# Patient Record
Sex: Female | Born: 2000 | Race: Black or African American | Hispanic: Yes | Marital: Single | Smoking: Never smoker
Health system: Southern US, Community
[De-identification: ages and names within clinical notes are randomized; demographics above are authoritative.]

## PROBLEM LIST (undated history)

## (undated) DIAGNOSIS — IMO0002 Reserved for concepts with insufficient information to code with codable children: Secondary | ICD-10-CM

## (undated) DIAGNOSIS — M329 Systemic lupus erythematosus, unspecified: Secondary | ICD-10-CM

## (undated) DIAGNOSIS — E559 Vitamin D deficiency, unspecified: Secondary | ICD-10-CM

## (undated) HISTORY — PX: SKIN BIOPSY: SHX1

---
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
## 2020-12-18 ENCOUNTER — Emergency Department (HOSPITAL_COMMUNITY)
Admission: EM | Admit: 2020-12-18 | Discharge: 2020-12-18 | Disposition: A | Discharge status: Home / Self Care | Payer: Medicaid Other | Attending: Emergency Medicine | Admitting: Emergency Medicine

## 2020-12-18 ENCOUNTER — Encounter (HOSPITAL_COMMUNITY): Payer: Self-pay

## 2020-12-18 ENCOUNTER — Other Ambulatory Visit: Payer: Self-pay

## 2020-12-18 DIAGNOSIS — R197 Diarrhea, unspecified: Secondary | ICD-10-CM | POA: Diagnosis not present

## 2020-12-18 DIAGNOSIS — R110 Nausea: Secondary | ICD-10-CM | POA: Insufficient documentation

## 2020-12-18 DIAGNOSIS — Z20822 Contact with and (suspected) exposure to covid-19: Secondary | ICD-10-CM | POA: Insufficient documentation

## 2020-12-18 DIAGNOSIS — R109 Unspecified abdominal pain: Secondary | ICD-10-CM | POA: Diagnosis not present

## 2020-12-18 DIAGNOSIS — R519 Headache, unspecified: Secondary | ICD-10-CM | POA: Diagnosis not present

## 2020-12-18 HISTORY — DX: Vitamin D deficiency, unspecified: E55.9

## 2020-12-18 HISTORY — DX: Reserved for concepts with insufficient information to code with codable children: IMO0002

## 2020-12-18 HISTORY — DX: Systemic lupus erythematosus, unspecified: M32.9

## 2020-12-18 LAB — COMPREHENSIVE METABOLIC PANEL
ALT: 16 U/L (ref 0–44)
AST: 32 U/L (ref 15–41)
Albumin: 4.2 g/dL (ref 3.5–5.0)
Alkaline Phosphatase: 85 U/L (ref 38–126)
Anion gap: 7 (ref 5–15)
BUN: 8 mg/dL (ref 6–20)
CO2: 28 mmol/L (ref 22–32)
Calcium: 9.1 mg/dL (ref 8.9–10.3)
Chloride: 101 mmol/L (ref 98–111)
Creatinine, Ser: 0.44 mg/dL (ref 0.44–1.00)
GFR, Estimated: >60 mL/min (ref >60)
Glucose, Bld: 87 mg/dL (ref 70–99)
Potassium: 3.9 mmol/L (ref 3.5–5.1)
Sodium: 136 mmol/L (ref 135–145)
Total Bilirubin: 0.3 mg/dL (ref 0.3–1.2)
Total Protein: 8.4 g/dL — ABNORMAL HIGH (ref 6.5–8.1)

## 2020-12-18 LAB — CBC WITH DIFFERENTIAL/PLATELET
Abs Immature Granulocytes: 0 10*3/uL (ref 0.00–0.07)
Basophils Absolute: 0 10*3/uL (ref 0.0–0.1)
Basophils Relative: 0 %
Eosinophils Absolute: 0 10*3/uL (ref 0.0–0.5)
Eosinophils Relative: 0 %
HCT: 48.2 % — ABNORMAL HIGH (ref 36.0–46.0)
Hemoglobin: 15.1 g/dL — ABNORMAL HIGH (ref 12.0–15.0)
Immature Granulocytes: 0 %
Lymphocytes Relative: 36 %
Lymphs Abs: 0.9 10*3/uL (ref 0.7–4.0)
MCH: 26.7 pg (ref 26.0–34.0)
MCHC: 31.3 g/dL (ref 30.0–36.0)
MCV: 85.2 fL (ref 80.0–100.0)
Monocytes Absolute: 0.1 10*3/uL (ref 0.1–1.0)
Monocytes Relative: 5 %
Neutro Abs: 1.4 10*3/uL — ABNORMAL LOW (ref 1.7–7.7)
Neutrophils Relative %: 59 %
Platelets: 182 10*3/uL (ref 150–400)
RBC: 5.66 MIL/uL — ABNORMAL HIGH (ref 3.87–5.11)
RDW: 13.2 % (ref 11.5–15.5)
WBC: 2.4 10*3/uL — ABNORMAL LOW (ref 4.0–10.5)
nRBC: 0 % (ref 0.0–0.2)

## 2020-12-18 LAB — I-STAT BETA HCG BLOOD, ED (MC, WL, AP ONLY): I-stat hCG, quantitative: <5 m[IU]/mL (ref <5)

## 2020-12-18 LAB — RESP PANEL BY RT-PCR (FLU A&B, COVID) ARPGX2
Influenza A by PCR: NEGATIVE
Influenza B by PCR: NEGATIVE
SARS Coronavirus 2 by RT PCR: NEGATIVE

## 2020-12-18 LAB — LIPASE, BLOOD: Lipase: 27 U/L (ref 11–51)

## 2020-12-18 MED ORDER — IBUPROFEN 600 MG PO TABS: 600.0000 mg | ORAL_TABLET | Freq: Four times a day (QID) | ORAL | 0 refills | Status: DC | PRN

## 2020-12-18 MED ORDER — ONDANSETRON HCL 4 MG/2ML IJ SOLN
4.0000 mg | Freq: Once | INTRAMUSCULAR | Status: AC
  Administered 2020-12-18: 4 mg via INTRAVENOUS
  Filled 2020-12-18: qty 2

## 2020-12-18 MED ORDER — DICYCLOMINE HCL 20 MG PO TABS: 20.0000 mg | ORAL_TABLET | Freq: Two times a day (BID) | ORAL | 0 refills | Status: DC | PRN

## 2020-12-18 MED ORDER — METHYLPREDNISOLONE SODIUM SUCC 125 MG IJ SOLR
125.0000 mg | Freq: Once | INTRAMUSCULAR | Status: AC
  Administered 2020-12-18: 125 mg via INTRAVENOUS
  Filled 2020-12-18: qty 2

## 2020-12-18 MED ORDER — DICYCLOMINE HCL 10 MG PO CAPS
20.0000 mg | ORAL_CAPSULE | Freq: Once | ORAL | Status: AC
  Administered 2020-12-18: 20 mg via ORAL
  Filled 2020-12-18: qty 2

## 2020-12-18 NOTE — ED Provider Notes (Signed)
[facility]-EMERGENCY DEPT Provider Note   CSN: [id number] Arrival date & time: 12/18/20  1053     History Chief Complaint  Patient presents with   Generalized Body Aches   Emesis    Alexandria Russell is a 20 y.o. female with a history of lupus presenting to emergency department with generalized body aches and nausea.  The patient reports that she moved to [facility] and does not have a rheumatologist, but was referred to 1 by her new PCP last week.  She says in the past she was on prednisone for several months for management of lupus, but stopped taking the medication due to side effects, which included poor appetite, nausea and vomiting.  She comes to the ED today complaining of generalized body aches.  She reports she is got tender nodes in her arms and legs which are bothering her.  She reports that she has not had a lot of vomiting but some dry heaving, as well as cramping diarrhea and abdominal pain.  She has been taking Zofran as needed for nausea, otherwise no medications.  She does have a Nexplanon and has irregular menstrual periods, most recently in July.  HPI     Past Medical History:  Diagnosis Date   Lupus (HCC)    Vitamin D deficiency     There are no problems to display for this patient.   History reviewed. No pertinent surgical history.   OB History   No obstetric history on file.     Family History  Problem Relation Age of Onset   Diverticulitis Mother     Social History   Tobacco Use   Smoking status: Never   Smokeless tobacco: Never  Vaping Use   Vaping Use: Every day   Substances: Nicotine  Substance Use Topics   Alcohol use: Yes   Drug use: Yes    Types: Marijuana    Home Medications Prior to Admission medications   Medication Sig Start Date End Date Taking? Authorizing Provider  dicyclomine (BENTYL) 20 MG tablet Take 1 tablet (20 mg total) by mouth 2 (two) times daily as needed for up to 20 doses for spasms. 12/18/20   Yes Zarriah Starkel, Kermit Balo, MD  ibuprofen (ADVIL) 600 MG tablet Take 1 tablet (600 mg total) by mouth every 6 (six) hours as needed for up to 30 doses for mild pain or moderate pain. 12/18/20  Yes Kaislyn Gulas, Kermit Balo, MD    Allergies    Penicillins  Review of Systems   Review of Systems  Constitutional:  Negative for chills and fever.  HENT:  Negative for ear pain and sore throat.   Eyes:  Negative for pain and visual disturbance.  Respiratory:  Negative for cough and shortness of breath.   Cardiovascular:  Negative for chest pain and palpitations.  Gastrointestinal:  Positive for abdominal pain and nausea. Negative for vomiting.  Genitourinary:  Negative for dysuria and hematuria.  Musculoskeletal:  Positive for arthralgias and myalgias.  Skin:  Negative for color change and rash.  Neurological:  Negative for syncope and headaches.  All other systems reviewed and are negative.  Physical Exam Updated Vital Signs BP 107/68   Pulse 61   Temp 98.7 F (37.1 C) (Oral)   Resp 16   Ht 5' (1.524 m)   Wt 47.6 kg   SpO2 99%   BMI 20.51 kg/m   Physical Exam Constitutional:      General: She is not in acute distress. HENT:  Head: Normocephalic and atraumatic.  Eyes:     Conjunctiva/sclera: Conjunctivae normal.     Pupils: Pupils are equal, round, and reactive to light.  Cardiovascular:     Rate and Rhythm: Normal rate and regular rhythm.  Pulmonary:     Effort: Pulmonary effort is normal. No respiratory distress.  Abdominal:     General: There is no distension.     Tenderness: There is no abdominal tenderness.  Musculoskeletal:     Comments: Tenderness of bilateral shins, no visible nodes  Skin:    General: Skin is warm and dry.  Neurological:     General: No focal deficit present.     Mental Status: She is alert. Mental status is at baseline.  Psychiatric:        Mood and Affect: Mood normal.        Behavior: Behavior normal.    ED Results / Procedures / Treatments    Labs (all labs ordered are listed, but only abnormal results are displayed) Labs Reviewed  COMPREHENSIVE METABOLIC PANEL - Abnormal; Notable for the following components:      Result Value   Total Protein 8.4 (*)    All other components within normal limits  CBC WITH DIFFERENTIAL/PLATELET - Abnormal; Notable for the following components:   WBC 2.4 (*)    RBC 5.66 (*)    Hemoglobin 15.1 (*)    HCT 48.2 (*)    Neutro Abs 1.4 (*)    All other components within normal limits  RESP PANEL BY RT-PCR (FLU A&B, COVID) ARPGX2  LIPASE, BLOOD  I-STAT BETA HCG BLOOD, ED ([facility], [facility], [facility] ONLY)    EKG None  Radiology No results found.  Procedures Procedures   Medications Ordered in ED Medications  ondansetron (ZOFRAN) injection 4 mg (4 mg Intravenous Given 12/18/20 1220)  methylPREDNISolone sodium succinate (SOLU-MEDROL) 125 mg/2 mL injection 125 mg (125 mg Intravenous Given 12/18/20 1220)  dicyclomine (BENTYL) capsule 20 mg (20 mg Oral Given 12/18/20 1220)    ED Course  I have reviewed the triage vital signs and the nursing notes.  Pertinent labs & imaging results that were available during my care of the patient were reviewed by me and considered in my medical decision making (see chart for details).  Differential diagnosis for patient's symptoms include irritable bowel disease versus autoimmune condition versus other.  Suspect this is likely a chronic condition related to her lupus or autoimmune disease.  She may have nodules related to this.  We will check her basic labs, give her some Zofran, shot of IV Solu-Medrol, as well as Bentyl for cramping diarrhea and abdominal pain.  She does not appear toxic otherwise.  She does not have focal tenderness of the right lower quadrant to suspect appendicitis.  The chronicity of her symptoms more likely indicate an autoimmune condition, and explained to her the importance of needing follow-up and establishing care with a rheumatologist for this.  I would  avoid starting her on chronic prednisone at this point as she felt she had  adverse side effects to long-term treatment.  I would defer this decision to discussion between her and her rheumatologist, and she has a referral currently to see one.   Labs reviewed -mild leukopenia, otherwise labs unremarkable.  Her leukopenia may be related to her autoimmune condition; she otherwise does not appear to be pancytopenic.  COVID is negative.   Final Clinical Impression(s) / ED Diagnoses Final diagnoses:  Abdominal pain, unspecified abdominal location    Rx /  DC Orders ED Discharge Orders          Ordered    dicyclomine (BENTYL) 20 MG tablet  2 times daily PRN        12/18/20 1415    ibuprofen (ADVIL) 600 MG tablet  Every 6 hours PRN        12/18/20 1415             Braeden Dolinski, Kermit Balo, MD 12/18/20 1713

## 2020-12-18 NOTE — ED Triage Notes (Signed)
Patient c/o generalized body aches x 1 month and vomiting when she woke this AM.  Patient also c/o raised areas/lumps on her legs and abdomen.

## 2020-12-28 ENCOUNTER — Encounter (HOSPITAL_COMMUNITY): Payer: Self-pay | Admitting: Emergency Medicine

## 2020-12-28 ENCOUNTER — Other Ambulatory Visit: Payer: Self-pay

## 2020-12-28 ENCOUNTER — Emergency Department (HOSPITAL_COMMUNITY)
Admission: EM | Admit: 2020-12-28 | Discharge: 2020-12-29 | Discharge status: Against Medical Advice | Payer: Medicaid Other | Attending: Physician Assistant | Admitting: Physician Assistant

## 2020-12-28 DIAGNOSIS — R109 Unspecified abdominal pain: Secondary | ICD-10-CM | POA: Diagnosis not present

## 2020-12-28 DIAGNOSIS — R110 Nausea: Secondary | ICD-10-CM | POA: Diagnosis not present

## 2020-12-28 DIAGNOSIS — R519 Headache, unspecified: Secondary | ICD-10-CM | POA: Insufficient documentation

## 2020-12-28 DIAGNOSIS — Z5321 Procedure and treatment not carried out due to patient leaving prior to being seen by health care provider: Secondary | ICD-10-CM | POA: Insufficient documentation

## 2020-12-28 DIAGNOSIS — M549 Dorsalgia, unspecified: Secondary | ICD-10-CM | POA: Diagnosis not present

## 2020-12-28 LAB — COMPREHENSIVE METABOLIC PANEL
ALT: 19 U/L (ref 0–44)
AST: 27 U/L (ref 15–41)
Albumin: 4.1 g/dL (ref 3.5–5.0)
Alkaline Phosphatase: 76 U/L (ref 38–126)
Anion gap: 9 (ref 5–15)
BUN: 9 mg/dL (ref 6–20)
CO2: 26 mmol/L (ref 22–32)
Calcium: 9.2 mg/dL (ref 8.9–10.3)
Chloride: 100 mmol/L (ref 98–111)
Creatinine, Ser: 0.57 mg/dL (ref 0.44–1.00)
GFR, Estimated: >60 mL/min (ref >60)
Glucose, Bld: 132 mg/dL — ABNORMAL HIGH (ref 70–99)
Potassium: 3.7 mmol/L (ref 3.5–5.1)
Sodium: 135 mmol/L (ref 135–145)
Total Bilirubin: 0.8 mg/dL (ref 0.3–1.2)
Total Protein: 7.9 g/dL (ref 6.5–8.1)

## 2020-12-28 LAB — CBC WITH DIFFERENTIAL/PLATELET
Abs Immature Granulocytes: 0.02 10*3/uL (ref 0.00–0.07)
Basophils Absolute: 0 10*3/uL (ref 0.0–0.1)
Basophils Relative: 0 %
Eosinophils Absolute: 0.2 10*3/uL (ref 0.0–0.5)
Eosinophils Relative: 3 %
HCT: 40.4 % (ref 36.0–46.0)
Hemoglobin: 12.9 g/dL (ref 12.0–15.0)
Immature Granulocytes: 0 %
Lymphocytes Relative: 21 %
Lymphs Abs: 1.4 10*3/uL (ref 0.7–4.0)
MCH: 27.2 pg (ref 26.0–34.0)
MCHC: 31.9 g/dL (ref 30.0–36.0)
MCV: 85.2 fL (ref 80.0–100.0)
Monocytes Absolute: 0.4 10*3/uL (ref 0.1–1.0)
Monocytes Relative: 7 %
Neutro Abs: 4.6 10*3/uL (ref 1.7–7.7)
Neutrophils Relative %: 69 %
Platelets: 183 10*3/uL (ref 150–400)
RBC: 4.74 MIL/uL (ref 3.87–5.11)
RDW: 13.4 % (ref 11.5–15.5)
WBC: 6.6 10*3/uL (ref 4.0–10.5)
nRBC: 0 % (ref 0.0–0.2)

## 2020-12-28 LAB — I-STAT BETA HCG BLOOD, ED (MC, WL, AP ONLY): I-stat hCG, quantitative: <5 m[IU]/mL (ref <5)

## 2020-12-28 NOTE — ED Notes (Signed)
Pt left ED with mother. Pt encouraged to stay. Pt declined.

## 2020-12-28 NOTE — ED Provider Notes (Signed)
Emergency Medicine Provider Triage Evaluation Note  Zenith Kercheval , a 20 y.o. female  was evaluated in triage.  Pt complains of headache an popping sensation in her brain. States she smoked marijuana pta and 5 min later she felt a pop inside her head and she feels like her brian is bleeding.  Review of Systems  Positive: headache Negative: vomiting  Physical Exam  BP (!) 140/91 (BP Location: Right Arm)   Pulse (!) 110   Temp 98.6 F (37 C) (Oral)   Resp 14   SpO2 100%  Gen:   Awake, no distress   Resp:  Normal effort  MSK:   Moves extremities without difficulty  Other:  No facial droop, clear speech, 5/5 strength to the bue/ble  Medical Decision Making  Medically screening exam initiated at 8:47 PM.  Appropriate orders placed.  Leafy Jane was informed that the remainder of the evaluation will be completed by another provider, this initial triage assessment does not replace that evaluation, and the importance of remaining in the ED until their evaluation is complete.     Rayne Du 12/28/20 2048    Mancel Bale, MD 12/30/20 1133

## 2020-12-28 NOTE — ED Triage Notes (Addendum)
Patient here with feeling of abdominal pain and swelling after smoking THC.  Nausea.  Speaking in complete sentences.  Oxygen 100%.  Headache.

## 2020-12-29 ENCOUNTER — Emergency Department (HOSPITAL_COMMUNITY)
Admission: EM | Admit: 2020-12-29 | Discharge: 2020-12-29 | Disposition: A | Discharge status: Home / Self Care | Payer: Medicaid Other | Source: Home / Self Care | Attending: Emergency Medicine | Admitting: Emergency Medicine

## 2020-12-29 ENCOUNTER — Encounter (HOSPITAL_COMMUNITY): Payer: Self-pay

## 2020-12-29 ENCOUNTER — Emergency Department (HOSPITAL_COMMUNITY): Payer: Medicaid Other

## 2020-12-29 DIAGNOSIS — R1084 Generalized abdominal pain: Secondary | ICD-10-CM | POA: Insufficient documentation

## 2020-12-29 DIAGNOSIS — R110 Nausea: Secondary | ICD-10-CM | POA: Insufficient documentation

## 2020-12-29 DIAGNOSIS — M549 Dorsalgia, unspecified: Secondary | ICD-10-CM | POA: Insufficient documentation

## 2020-12-29 LAB — COMPREHENSIVE METABOLIC PANEL
ALT: 18 U/L (ref 0–44)
AST: 25 U/L (ref 15–41)
Albumin: 4.2 g/dL (ref 3.5–5.0)
Alkaline Phosphatase: 79 U/L (ref 38–126)
Anion gap: 7 (ref 5–15)
BUN: 10 mg/dL (ref 6–20)
CO2: 25 mmol/L (ref 22–32)
Calcium: 9.5 mg/dL (ref 8.9–10.3)
Chloride: 104 mmol/L (ref 98–111)
Creatinine, Ser: 0.42 mg/dL — ABNORMAL LOW (ref 0.44–1.00)
GFR, Estimated: >60 mL/min (ref >60)
Glucose, Bld: 75 mg/dL (ref 70–99)
Potassium: 3.8 mmol/L (ref 3.5–5.1)
Sodium: 136 mmol/L (ref 135–145)
Total Bilirubin: 0.7 mg/dL (ref 0.3–1.2)
Total Protein: 8.3 g/dL — ABNORMAL HIGH (ref 6.5–8.1)

## 2020-12-29 LAB — URINALYSIS, ROUTINE W REFLEX MICROSCOPIC
Bilirubin Urine: NEGATIVE
Glucose, UA: NEGATIVE mg/dL
Hgb urine dipstick: NEGATIVE
Ketones, ur: NEGATIVE mg/dL
Leukocytes,Ua: NEGATIVE
Nitrite: NEGATIVE
Protein, ur: NEGATIVE mg/dL
Specific Gravity, Urine: 1.01 (ref 1.005–1.030)
pH: 8 (ref 5.0–8.0)

## 2020-12-29 LAB — CBC
HCT: 42.5 % (ref 36.0–46.0)
Hemoglobin: 13.3 g/dL (ref 12.0–15.0)
MCH: 26.9 pg (ref 26.0–34.0)
MCHC: 31.3 g/dL (ref 30.0–36.0)
MCV: 86 fL (ref 80.0–100.0)
Platelets: 189 10*3/uL (ref 150–400)
RBC: 4.94 MIL/uL (ref 3.87–5.11)
RDW: 13.7 % (ref 11.5–15.5)
WBC: 3.8 10*3/uL — ABNORMAL LOW (ref 4.0–10.5)
nRBC: 0 % (ref 0.0–0.2)

## 2020-12-29 LAB — LIPASE, BLOOD: Lipase: 35 U/L (ref 11–51)

## 2020-12-29 MED ORDER — IOHEXOL 350 MG/ML SOLN
100.0000 mL | Freq: Once | INTRAVENOUS | Status: AC | PRN
  Administered 2020-12-29: 60 mL via INTRAVENOUS

## 2020-12-29 NOTE — ED Notes (Signed)
IV found in room on bedside table. Patient and belongings not found in room.

## 2020-12-29 NOTE — ED Notes (Signed)
PA aware patient has eloped.

## 2020-12-29 NOTE — ED Provider Notes (Signed)
[facility]-EMERGENCY DEPT Provider Note   CSN: [id number] Arrival date & time: 12/29/20  1142     History Chief Complaint  Patient presents with   Abdominal Pain    Alexandria Russell is a 20 y.o. female.  Patient with history of lupus presents today with abdominal pain.  States the pain has been intermittent over the last 6 months however is worsened in the last day and is now radiating to her back.  Some associated nausea and occasional vomiting, however no vomiting during this episode.  She has difficulty localizing the pain, states it is diffuse throughout her abdomen.  Denies fever, chills, chest pain, shortness of breath.  Last bowel movement was this morning and normal.  Of note, history of long-term prednisone use, but has not been taking this for over a year.  She has not seen a rheumatologist or been on any lupus medication over the past year.  The history is provided by the patient.  Abdominal Pain Associated symptoms: no chest pain, no chills, no constipation, no cough, no diarrhea, no fatigue, no fever, no nausea, no shortness of breath, no sore throat and no vomiting       Past Medical History:  Diagnosis Date   Lupus (HCC)    Vitamin D deficiency     There are no problems to display for this patient.   History reviewed. No pertinent surgical history.   OB History   No obstetric history on file.     Family History  Problem Relation Age of Onset   Diverticulitis Mother     Social History   Tobacco Use   Smoking status: Never   Smokeless tobacco: Never  Vaping Use   Vaping Use: Every day   Substances: Nicotine  Substance Use Topics   Alcohol use: Yes   Drug use: Yes    Types: Marijuana    Home Medications Prior to Admission medications   Medication Sig Start Date End Date Taking? Authorizing Provider  dicyclomine (BENTYL) 20 MG tablet Take 1 tablet (20 mg total) by mouth 2 (two) times daily as needed for up to 20 doses for  spasms. 12/18/20   Terald Sleeper, MD  ibuprofen (ADVIL) 600 MG tablet Take 1 tablet (600 mg total) by mouth every 6 (six) hours as needed for up to 30 doses for mild pain or moderate pain. 12/18/20   Terald Sleeper, MD    Allergies    Penicillins  Review of Systems   Review of Systems  Constitutional:  Negative for chills, fatigue and fever.  HENT:  Negative for congestion, postnasal drip, rhinorrhea, sore throat, trouble swallowing and voice change.   Respiratory:  Negative for cough and shortness of breath.   Cardiovascular:  Negative for chest pain, palpitations and leg swelling.  Gastrointestinal:  Positive for abdominal pain. Negative for abdominal distention, constipation, diarrhea, nausea and vomiting.  Skin:  Negative for color change, pallor, rash and wound.  Neurological:  Negative for tremors, seizures, syncope, facial asymmetry, speech difficulty, weakness and numbness.  Psychiatric/Behavioral:  Negative for confusion and decreased concentration.   All other systems reviewed and are negative.  Physical Exam Updated Vital Signs BP 113/82 (BP Location: Right Arm)   Pulse 87   Temp 98.4 F (36.9 C) (Oral)   Resp 16   SpO2 99%   Physical Exam Vitals and nursing note reviewed.  Constitutional:      General: She is not in acute distress.    Appearance: Normal  appearance. She is not ill-appearing, toxic-appearing or diaphoretic.  HENT:     Head: Normocephalic.     Mouth/Throat:     Mouth: Mucous membranes are moist.  Eyes:     General: No scleral icterus. Cardiovascular:     Rate and Rhythm: Normal rate and regular rhythm.     Heart sounds: No murmur heard. Pulmonary:     Effort: Pulmonary effort is normal. No respiratory distress.     Breath sounds: Normal breath sounds. No stridor. No wheezing or rales.  Abdominal:     General: Abdomen is flat. Bowel sounds are normal.     Palpations: Abdomen is soft.     Tenderness: There is generalized abdominal tenderness.  There is no guarding or rebound. Negative signs include Murphy's sign, Rovsing's sign, McBurney's sign, psoas sign and obturator sign.  Musculoskeletal:     Cervical back: Normal range of motion.  Skin:    General: Skin is warm and dry.  Neurological:     General: No focal deficit present.     Mental Status: She is alert.     Motor: No weakness.  Psychiatric:        Mood and Affect: Mood normal.        Behavior: Behavior normal.    ED Results / Procedures / Treatments   Labs (all labs ordered are listed, but only abnormal results are displayed) Labs Reviewed  CBC  COMPREHENSIVE METABOLIC PANEL  LIPASE, BLOOD    EKG None  Radiology CT ABDOMEN PELVIS W CONTRAST  Result Date: 12/29/2020 CLINICAL DATA:  Abdominal pain, acute, nonlocalized EXAM: CT ABDOMEN AND PELVIS WITH CONTRAST TECHNIQUE: Multidetector CT imaging of the abdomen and pelvis was performed using the standard protocol following bolus administration of intravenous contrast. CONTRAST:  32mL OMNIPAQUE IOHEXOL 350 MG/ML SOLN COMPARISON:  None. FINDINGS: Lower chest: No acute abnormality. Hepatobiliary: No focal liver abnormality is seen. No gallstones, gallbladder wall thickening, or biliary dilatation. Pancreas: Unremarkable. No pancreatic ductal dilatation or surrounding inflammatory changes. Spleen: Unremarkable Adrenals/Urinary Tract: Adrenals, kidneys, and bladder are unremarkable. Stomach/Bowel: Stomach is within normal limits. Bowel is normal in caliber. Normal appendix. Vascular/Lymphatic: No significant vascular findings. No enlarged lymph nodes. Reproductive: Uterus and bilateral adnexa are unremarkable. Other: There are patchy areas of infiltration of the subcutaneous fat of the abdominopelvic wall and included thighs. For example, more confluent areas within ventral abdominal wall near the umbilicus (series 2, image 42). Musculoskeletal: No acute or significant osseous abnormality IMPRESSION: Patchy areas of  infiltration of subcutaneous fat of the abdominopelvic bowl and included upper thighs. For example, ventral abdominal wall near the umbilicus. This is nonspecific but could be infectious/inflammatory. Otherwise unremarkable study. Electronically Signed   By: Guadlupe Spanish M.D.   On: 12/29/2020 14:20    Procedures Procedures   Medications Ordered in ED Medications - No data to display  ED Course  I have reviewed the triage vital signs and the nursing notes.  Pertinent labs & imaging results that were available during my care of the patient were reviewed by me and considered in my medical decision making (see chart for details).    MDM Rules/Calculators/A&P                         Presents today with abdominal pain that has been intermittent over the last 6 months, however is worse today with radiating to the back. Nontoxic, vitals unremarkable, patient is afebrile, nontoxic-appearing, and in no acute distress.  Additional history obtained:  Additional history obtained from chart review & nursing note review.   Lab Tests:  I Ordered, reviewed, and interpreted labs, which included:  CBC, CMP, lipase, UA No anemia or leukocytosis.  Electrolytes, pancreas, kidney, liver function normal.  UA normal  Imaging Studies ordered:  I ordered imaging studies which included CT abdomen and pelvis with contrast, I independently reviewed, formal radiology impression shows:  Patchy areas of infiltration of subcutaneous fat of the abdominopelvic bowl and included upper thighs. For example, ventral abdominal wall near the umbilicus. This is nonspecific but could be infectious/inflammatory.  ED Course:  Patient presents with intermittent abdominal pain worsened today and is radiating to her back.  Labs unremarkable, however CT abdomen and pelvis showed unusual findings of areas of infiltration of subcutaneous fat.  Gust findings with radiologist who read the CT Dr. Allena Katz, who was unsure what to make  of these findings.  Recommended discussing further with patient and considering ultrasound to visualize these areas.   Unfortunately, upon entering patient's room to discuss results and further evaluate for potential causes of findings on CT, patient was not in room, IV was removed and lying on bedside table, patient's belongings were also missing.  Confirmed with nursing staff that patient had eloped.  Portions of this note were generated with Scientist, clinical (histocompatibility and immunogenetics). Dictation errors may occur despite best attempts at proofreading.         Final Clinical Impression(s) / ED Diagnoses Final diagnoses:  Generalized abdominal pain    Rx / DC Orders ED Discharge Orders     None        Vear Clock 12/29/20 2217    Lorre Nick, MD 12/30/20 1144

## 2020-12-29 NOTE — ED Triage Notes (Signed)
Pt arrived via POV, c/o diffuse abd pain, radiating to back.

## 2021-02-19 ENCOUNTER — Ambulatory Visit: Payer: Medicaid Other | Admitting: Internal Medicine

## 2021-02-20 NOTE — Progress Notes (Signed)
Office Visit Note  Patient: Alexandria Russell             Date of Birth: 2000-04-20           MRN: [id number]             PCP: Zara Chess, NP Referring: Zara Chess, NP Visit Date: 02/21/2021 Occupation: Photographer work  Subjective:  New Patient (Initial Visit) (Lupus)   History of Present Illness: Alexandria Russell is a 20 y.o. 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 She was originally diagnosed about 2 years ago with initial steroid treatment of about 6 months. Primary symptoms at that time include joint pains in multiple sites and skin rashes on the face and nodular skin rashes on her torso and limbs. Diagnosis including skin biopsy taken from the leg and arm and breast. She was treated with imuran and hydroxychloroquine apparently stopped imuran due to nausea and switch to mycophenolate. She stopped taking all medications due to feeling quite sick on the combination and did not follow up regularly for about 2 years. She experienced several flares of symptom worsening with joint pain and pain over areas of skin swelling and redness usually treated with short term steroid treatments. She moved from [place] area to [place] around the start of this year and established with local physician treated with oral prednisone 76m daily at this time partially controlling symptoms. She last had an episode of increased symptoms about 3 weeks ago. She is also taking high dose weekly vitamin d supplementation for low vitamin D. Besides the panniculitis symptoms she also reports patchy alopecia on the scalp and on temporal areas associated with posterior cervical adenopathy. She does notice symptoms increased with high amounts of sun exposure. She has lost about 10-20 pounds since this all started but stable during the past year. She denies any raynaud's symptoms or any history of blood clots.  Labs reviewed 12/2020 Vit D 15.0  05/2018 ANA 1:160 dsDNA neg ESR 27 CRP <4 Complement C3 C4  wnl TPMT 18.9 14-3-3 eta neg ANCAs neg TB neg HBV HCV neg   Activities of Daily Living:  Patient reports morning stiffness for 2 hours.   Patient Reports nocturnal pain.  Difficulty dressing/grooming: Denies Difficulty climbing stairs: Reports Difficulty getting out of chair: Denies Difficulty using hands for taps, buttons, cutlery, and/or writing: Denies  Review of Systems  Constitutional:  Negative for fatigue.  HENT:  Negative for mouth dryness.   Eyes:  Negative for dryness.  Respiratory:  Negative for shortness of breath.   Cardiovascular:  Positive for swelling in legs/feet.  Gastrointestinal:  Negative for constipation.  Endocrine: Positive for heat intolerance.  Genitourinary:  Negative for difficulty urinating.  Musculoskeletal:  Positive for joint pain, joint pain, joint swelling, muscle weakness, morning stiffness and muscle tenderness.  Skin:  Positive for rash.  Allergic/Immunologic: Positive for susceptible to infections.  Neurological:  Positive for weakness.  Hematological:  Positive for bruising/bleeding tendency.  Psychiatric/Behavioral:  Positive for sleep disturbance.    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   Objective: Vital Signs: BP 99/62 (BP Location: Left Arm, Patient Position: Sitting, Cuff Size: Normal)   Pulse 83   Resp 14   Ht 5' (1.524 m)   Wt 112 lb (50.8 kg)   BMI 21.87 kg/m    Physical Exam HENT:     Right Ear: External ear normal.     Left Ear:  External ear normal.     Mouth/Throat:     Mouth: Mucous membranes are moist.     Pharynx: Oropharynx is clear.  Eyes:     Conjunctiva/sclera: Conjunctivae normal.  Cardiovascular:     Rate and Rhythm: Normal rate and regular rhythm.  Pulmonary:     Effort: Pulmonary effort is normal.     Breath sounds: Normal breath sounds.  Musculoskeletal:     Right lower leg: No edema.     Left lower leg: No edema.  Lymphadenopathy:     Cervical: No cervical adenopathy.  Skin:    General: Skin is warm and dry.     Comments: Firm tender subcutaneous nodule with overlying erythema and warmth in left cheek, right thigh Firm nodules in left upper arm, abdominal wall most concentrated around umbilicus  Neurological:     Mental Status: She is alert.  Psychiatric:        Mood and Affect: Mood normal.      Musculoskeletal Exam:  Neck full ROM no tenderness Shoulders full ROM no tenderness or swelling Elbows full ROM no tenderness or swelling Wrists full ROM no tenderness or swelling Fingers full ROM no tenderness or swelling Knees full ROM no tenderness or swelling Ankles full ROM no tenderness or swelling   Investigation: No additional findings.  Imaging: No results found.  Recent Labs: Lab Results  Component Value Date   WBC 3.8 (L) 12/29/2020   HGB 13.3 12/29/2020   PLT 189 12/29/2020   NA 136 12/29/2020   K 3.8 12/29/2020   CL 104 12/29/2020   CO2 25 12/29/2020   GLUCOSE 75 12/29/2020   BUN 10 12/29/2020   CREATININE 0.42 (L) 12/29/2020   BILITOT 0.7 12/29/2020   ALKPHOS 79 12/29/2020   AST 25 12/29/2020   ALT 18 12/29/2020   PROT 8.3 (H) 12/29/2020   ALBUMIN 4.2 12/29/2020   CALCIUM 9.5 12/29/2020    Speciality Comments: No specialty comments available.  Procedures:  No procedures performed Allergies: Penicillins   Assessment / Plan:     Visit Diagnoses: Lupus profundus - Plan: Anti-DNA antibody, double-stranded, Protein / creatinine ratio, urine, C3 and C4,  Sedimentation rate, C-reactive protein, hydroxychloroquine (PLAQUENIL) 200 MG tablet, predniSONE (DELTASONE) 5 MG tablet  Active lesions in at least left cheek and right thigh, nodules in arms may be residual fat necrosis/atrophy. Joint pains look more arthralgia than any inflammatory changes on exam. No history of organ system injury and interestingly negative ANA so less likely SLE. Will check dsDNA, complements, ESR, CRP for disease activity monitoring. Start hydroxychloroquine 200 mg PO daily and will continue prednisone 5 mg PO daily.  Vitamin D deficiency - Plan: VITAMIN D 25 Hydroxy (Vit-D Deficiency, Fractures)  History of deficiency treated with high dose weekly supplement will repeat today and may be able to switch to maintenance dose.   High risk medication use  Discussed need for ophthalmology exam and follow up for hydroxychloroquine toxicity monitoring. Will continue low dose prednisone 5  mg daily for now discussed side effects of long term steroids including weight gain, hyperglycemia, osteoporosis.  Orders: Orders Placed This Encounter  Procedures   Anti-DNA antibody, double-stranded   Protein / creatinine ratio, urine   C3 and C4   Sedimentation rate   C-reactive protein   VITAMIN D 25 Hydroxy (Vit-D Deficiency, Fractures)    Meds ordered this encounter  Medications   hydroxychloroquine (PLAQUENIL) 200 MG tablet    Sig: Take 1 tablet (200 mg total) by mouth daily.    Dispense:  30 tablet    Refill:  2   predniSONE (DELTASONE) 5 MG tablet    Sig: Take 1 tablet (5 mg total) by mouth daily with breakfast.    Dispense:  30 tablet    Refill:  2     Follow-Up Instructions: Return in about 10 weeks (around 05/02/2021) for New pt LEP HCQ/GC start f/u 8-10wks.   Collier Salina, MD  Note - This record has been created using [employer].  Chart creation errors have been sought, but may not always  have been located. Such creation errors do not reflect on  the  standard of medical care.

## 2021-02-21 ENCOUNTER — Ambulatory Visit: Payer: Medicaid Other | Admitting: Internal Medicine

## 2021-02-21 ENCOUNTER — Other Ambulatory Visit: Payer: Self-pay

## 2021-02-21 ENCOUNTER — Encounter: Payer: Self-pay | Admitting: Internal Medicine

## 2021-02-21 VITALS — BP 99/62 | HR 83 | Resp 14 | Ht 60.0 in | Wt 112.0 lb

## 2021-02-21 DIAGNOSIS — L932 Other local lupus erythematosus: Secondary | ICD-10-CM | POA: Diagnosis not present

## 2021-02-21 DIAGNOSIS — E559 Vitamin D deficiency, unspecified: Secondary | ICD-10-CM | POA: Diagnosis not present

## 2021-02-21 DIAGNOSIS — Z79899 Other long term (current) drug therapy: Secondary | ICD-10-CM

## 2021-02-21 MED ORDER — PREDNISONE 5 MG PO TABS: 5.0000 mg | ORAL_TABLET | Freq: Every day | ORAL | 2 refills | Status: DC

## 2021-02-21 MED ORDER — HYDROXYCHLOROQUINE SULFATE 200 MG PO TABS: 200.0000 mg | ORAL_TABLET | Freq: Every day | ORAL | 2 refills | Status: DC

## 2021-02-21 NOTE — Patient Instructions (Signed)
Hydroxychloroquine Tablets °What is this medication? °HYDROXYCHLOROQUINE (hye drox ee KLOR oh kwin) treats autoimmune conditions, such as rheumatoid arthritis and lupus. It works by slowing down an overactive immune system. It may also be used to prevent and treat malaria. It works by killing the parasite that causes malaria. It belongs to a group of medications called DMARDs. °This medicine may be used for other purposes; ask your health care provider or pharmacist if you have questions. °COMMON BRAND NAME(S): Plaquenil, Quineprox °What should I tell my care team before I take this medication? °They need to know if you have any of these conditions: °Diabetes °Eye disease, vision problems °G6PD deficiency °Heart disease °History of irregular heartbeat °If you often drink alcohol °Kidney disease °Liver disease °Porphyria °Psoriasis °An unusual or allergic reaction to chloroquine, hydroxychloroquine, other medications, foods, dyes, or preservatives °Pregnant or trying to get pregnant °Breast-feeding °How should I use this medication? °Take this medication by mouth with a glass of water. Take it as directed on the prescription label. Do not cut, crush or chew this medication. Swallow the tablets whole. Take it with food. Do not take it more than directed. Take all of this medication unless your care team tells you to stop it early. Keep taking it even if you think you are better. °Take products with antacids in them at a different time of day than this medication. Take this medication 4 hours before or 4 hours after antacids. Talk to your care team if you have questions. °Talk to your care team about the use of this medication in children. While this medication may be prescribed for selected conditions, precautions do apply. °Overdosage: If you think you have taken too much of this medicine contact a poison control center or emergency room at once. °NOTE: This medicine is only for you. Do not share this medicine with  others. °What if I miss a dose? °If you miss a dose, take it as soon as you can. If it is almost time for your next dose, take only that dose. Do not take double or extra doses. °What may interact with this medication? °Do not take this medication with any of the following: °Cisapride °Dronedarone °Pimozide °Thioridazine °This medication may also interact with the following: °Ampicillin °Antacids °Cimetidine °Cyclosporine °Digoxin °Kaolin °Medications for diabetes, like insulin, glipizide, glyburide °Medications for seizures like carbamazepine, phenobarbital, phenytoin °Mefloquine °Methotrexate °Other medications that prolong the QT interval (cause an abnormal heart rhythm) °Praziquantel °This list may not describe all possible interactions. Give your health care provider a list of all the medicines, herbs, non-prescription drugs, or dietary supplements you use. Also tell them if you smoke, drink alcohol, or use illegal drugs. Some items may interact with your medicine. °What should I watch for while using this medication? °Visit your care team for regular checks on your progress. Tell your care team if your symptoms do not start to get better or if they get worse. °You may need blood work done while you are taking this medication. If you take other medications that can affect heart rhythm, you may need more testing. Talk to your care team if you have questions. °Your vision may be tested before and during use of this medication. Tell your care team right away if you have any change in your eyesight. °This medication may cause serious skin reactions. They can happen weeks to months after starting the medication. Contact your care team right away if you notice fevers or flu-like symptoms with a rash. The   rash may be red or purple and then turn into blisters or peeling of the skin. Or, you might notice a red rash with swelling of the face, lips or lymph nodes in your neck or under your arms. °If you or your family  notice any changes in your behavior, such as new or worsening depression, thoughts of harming yourself, anxiety, or other unusual or disturbing thoughts, or memory loss, call your care team right away. °What side effects may I notice from receiving this medication? °Side effects that you should report to your care team as soon as possible: °Allergic reactions--skin rash, itching, hives, swelling of the face, lips, tongue, or throat °Aplastic anemia--unusual weakness or fatigue, dizziness, headache, trouble breathing, increased bleeding or bruising °Change in vision °Heart rhythm changes--fast or irregular heartbeat, dizziness, feeling faint or lightheaded, chest pain, trouble breathing °Infection--fever, chills, cough, or sore throat °Low blood sugar (hypoglycemia)--tremors or shaking, anxiety, sweating, cold or clammy skin, confusion, dizziness, rapid heartbeat °Muscle injury--unusual weakness or fatigue, muscle pain, dark yellow or brown urine, decrease in amount of urine °Pain, tingling, or numbness in the hands or feet °Rash, fever, and swollen lymph nodes °Redness, blistering, peeling, or loosening of the skin, including inside the mouth °Thoughts of suicide or self-harm, worsening mood, or feelings of depression °Unusual bruising or bleeding °Side effects that usually do not require medical attention (report to your care team if they continue or are bothersome): °Diarrhea °Headache °Nausea °Stomach pain °Vomiting °This list may not describe all possible side effects. Call your doctor for medical advice about side effects. You may report side effects to FDA at 1-800-FDA-1088. °Where should I keep my medication? °Keep out of the reach of children and pets. °Store at room temperature up to 30 degrees C (86 degrees F). Protect from light. Get rid of any unused medication after the expiration date. °To get rid of medications that are no longer needed or have expired: °Take the medication to a medication take-back  program. Check with your pharmacy or law enforcement to find a location. °If you cannot return the medication, check the label or package insert to see if the medication should be thrown out in the garbage or flushed down the toilet. If you are not sure, ask your care team. If it is safe to put it in the trash, empty the medication out of the container. Mix the medication with cat litter, dirt, coffee grounds, or other unwanted substance. Seal the mixture in a bag or container. Put it in the trash. °NOTE: This sheet is a summary. It may not cover all possible information. If you have questions about this medicine, talk to your doctor, pharmacist, or health care provider. °© 2022 [employer]/Gold Standard (2020-08-17 00:00:00) ° °

## 2021-02-22 ENCOUNTER — Ambulatory Visit: Payer: Medicaid Other | Admitting: Internal Medicine

## 2021-02-22 LAB — C3 AND C4
C3 Complement: 163 mg/dL (ref 83–193)
C4 Complement: 36 mg/dL (ref 15–57)

## 2021-02-22 LAB — PROTEIN / CREATININE RATIO, URINE
Creatinine, Urine: 27 mg/dL (ref 20–275)
Total Protein, Urine: <4 mg/dL — ABNORMAL LOW (ref 5–24)

## 2021-02-22 LAB — ANTI-DNA ANTIBODY, DOUBLE-STRANDED: ds DNA Ab: 1 IU/mL

## 2021-02-22 LAB — SEDIMENTATION RATE: Sed Rate: 28 mm/h — ABNORMAL HIGH (ref 0–20)

## 2021-02-22 LAB — C-REACTIVE PROTEIN: CRP: 1.9 mg/L (ref <8.0)

## 2021-02-22 LAB — VITAMIN D 25 HYDROXY (VIT D DEFICIENCY, FRACTURES): Vit D, 25-Hydroxy: 68 ng/mL (ref 30–100)

## 2021-02-23 NOTE — Progress Notes (Signed)
Lab results look good no evidence of systemic lupus or lupus kidney disease. Sedimentation rate is 28 which is high consistent with some ongoing inflammation. Vitamin D level is 68 which is plenty high, okay to stop taking the high dose weekly pill should be okay taking 2000 units daily as an oral supplement for maintenance.

## 2021-03-05 ENCOUNTER — Ambulatory Visit: Payer: Medicaid Other | Admitting: Internal Medicine

## 2021-03-16 ENCOUNTER — Ambulatory Visit: Payer: Self-pay | Admitting: *Deleted

## 2021-03-18 NOTE — Telephone Encounter (Signed)
Pt needed list of psychiatrists who take Medicare. Advised pt to call [facility] and number provided. Also informed pt of area psychiatrists that take medicaid as per this web link:  [email or web address]  Link sent to pt via MyChart.    Reason for Disposition  General information question, no triage required and triager able to answer question  Answer Assessment - Initial Assessment Questions 1. REASON FOR CALL or QUESTION: "What is your reason for calling today?" or "How can I best help you?" or "What question do you have that I can help answer?"     Pt needs information and list of psychiatrists who take Medicaid  Protocols used: Information Only Call - No Triage-A-AH

## 2021-05-01 NOTE — Progress Notes (Unsigned)
Office Visit Note  Patient: Alexandria Russell             Date of Birth: [birth year]           MRN: [id number]             PCP: Zara Chess, NP Referring: Zara Chess, NP Visit Date: 05/02/2021   Subjective:  No chief complaint on file.   History of Present Illness: Alexandria Russell is a 21 y.o. female here for follow up for lupus profundus after starting HCQ 200 mg daily and continuing prednisone 5 mg daily.***   Previous HPI 02/21/21 Alexandria Russell is a 21 y.o. 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 She was originally diagnosed about 2 years ago with initial steroid treatment of about 6 months. Primary symptoms at that time include joint pains in multiple sites and skin rashes on the face and nodular skin rashes on her torso and limbs. Diagnosis including skin biopsy taken from the leg and arm and breast. She was treated with imuran and hydroxychloroquine apparently stopped imuran due to nausea and switch to mycophenolate. She stopped taking all medications due to feeling quite sick on the combination and did not follow up regularly for about 2 years. She experienced several flares of symptom worsening with joint pain and pain over areas of skin swelling and redness usually treated with short term steroid treatments. She moved from [place] area to [place] around the start of this year and established with local physician treated with oral prednisone 5mg  daily at this time partially controlling symptoms. She last had an episode of increased symptoms about 3 weeks ago. She is also taking high dose weekly vitamin d supplementation for low vitamin D. Besides the panniculitis symptoms she also reports patchy alopecia on the scalp and on temporal areas associated with posterior cervical adenopathy. She does notice symptoms increased with high amounts of sun exposure. She has lost about 10-20 pounds since this all started but stable during the past year. She denies any raynaud's symptoms or any  history of blood clots.   Labs reviewed 12/2020 Vit D 15.0   05/2018 ANA 1:160 TPMT 18.9   Review of Systems  Constitutional:  Negative for fatigue.  HENT:  Negative for mouth sores, mouth dryness and nose dryness.   Eyes:  Positive for photophobia, pain and visual disturbance. Negative for itching and dryness.  Respiratory:  Negative for shortness of breath and difficulty breathing.   Cardiovascular:  Positive for chest pain. Negative for palpitations.  Gastrointestinal:  Negative for blood in stool, constipation and diarrhea.  Endocrine: Negative for increased urination.  Genitourinary:  Negative for difficulty urinating.  Musculoskeletal:  Negative for joint pain, joint pain, joint swelling, myalgias, morning stiffness, muscle tenderness and myalgias.  Skin:  Negative for color change, rash and redness.  Allergic/Immunologic: Positive for susceptible to infections.  Neurological:  Negative for dizziness, numbness, headaches, memory loss and weakness.  Hematological:  Negative for bruising/bleeding tendency.  Psychiatric/Behavioral:  Negative for confusion.    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   Objective: Vital Signs: BP 111/73 (BP Location: Left Arm, Patient Position: Sitting, Cuff Size: Normal)    Pulse 96    Ht 5' (1.524 m)    Wt 124 lb (56.2 kg)    BMI 24.22 kg/m    Physical Exam   Face and leg clear  *** Abdominal wall area on left and below umbilicus with absent subcutaneous  fat layer  Musculoskeletal Exam: ***    Investigation: No additional findings.  Imaging: No results found.  Recent Labs: Lab Results  Component Value Date   WBC 3.8 (L) 12/29/2020   HGB 13.3 12/29/2020   PLT 189 12/29/2020   NA 136 12/29/2020   K 3.8 12/29/2020   CL 104 12/29/2020   CO2 25 12/29/2020   GLUCOSE 75 12/29/2020   BUN 10 12/29/2020   CREATININE 0.42 (L) 12/29/2020   BILITOT 0.7 12/29/2020   ALKPHOS 79 12/29/2020   AST 25 12/29/2020   ALT 18 12/29/2020   PROT 8.3 (H) 12/29/2020   ALBUMIN 4.2 12/29/2020   CALCIUM 9.5 12/29/2020    Speciality Comments: No specialty comments available.  Procedures:  No procedures performed Allergies: Penicillins   Assessment / Plan:     Visit Diagnoses: Lupus profundus  ***  Orders: No orders of the defined types were placed in this encounter.  No orders of the defined types were placed in this encounter.    Follow-Up Instructions: No follow-ups on file.   Collier Salina, MD  Note - This record has been created using [employer].  Chart creation errors have been sought, but may not always  have been located. Such creation errors do not reflect on  the standard of medical care.

## 2021-05-02 ENCOUNTER — Encounter: Payer: Self-pay | Admitting: *Deleted

## 2021-05-02 ENCOUNTER — Ambulatory Visit (INDEPENDENT_AMBULATORY_CARE_PROVIDER_SITE_OTHER): Payer: Medicaid Other | Admitting: Internal Medicine

## 2021-05-02 ENCOUNTER — Other Ambulatory Visit: Payer: Self-pay

## 2021-05-02 ENCOUNTER — Encounter: Payer: Self-pay | Admitting: Internal Medicine

## 2021-05-02 VITALS — BP 111/73 | HR 96 | Ht 60.0 in | Wt 124.0 lb

## 2021-05-02 DIAGNOSIS — G43709 Chronic migraine without aura, not intractable, without status migrainosus: Secondary | ICD-10-CM

## 2021-05-02 DIAGNOSIS — L932 Other local lupus erythematosus: Secondary | ICD-10-CM | POA: Diagnosis not present

## 2021-05-02 DIAGNOSIS — Z79899 Other long term (current) drug therapy: Secondary | ICD-10-CM | POA: Diagnosis not present

## 2021-05-02 DIAGNOSIS — G43909 Migraine, unspecified, not intractable, without status migrainosus: Secondary | ICD-10-CM | POA: Insufficient documentation

## 2021-05-02 MED ORDER — HYDROXYCHLOROQUINE SULFATE 200 MG PO TABS: 200.0000 mg | ORAL_TABLET | Freq: Every day | ORAL | 2 refills | Status: DC

## 2021-05-02 MED ORDER — RIZATRIPTAN BENZOATE 5 MG PO TABS: 5.0000 mg | ORAL_TABLET | ORAL | 1 refills | Status: DC | PRN

## 2021-05-02 MED ORDER — PREDNISONE 2.5 MG PO TABS: 2.5000 mg | ORAL_TABLET | Freq: Every day | ORAL | 2 refills | Status: DC

## 2021-05-22 ENCOUNTER — Ambulatory Visit (INDEPENDENT_AMBULATORY_CARE_PROVIDER_SITE_OTHER): Payer: Medicaid Other | Admitting: Psychiatry

## 2021-05-22 ENCOUNTER — Encounter (HOSPITAL_COMMUNITY): Payer: Self-pay | Admitting: Psychiatry

## 2021-05-22 VITALS — BP 118/70 | Temp 98.0°F | Ht 60.0 in | Wt 127.0 lb

## 2021-05-22 DIAGNOSIS — F331 Major depressive disorder, recurrent, moderate: Secondary | ICD-10-CM | POA: Diagnosis not present

## 2021-05-22 DIAGNOSIS — F411 Generalized anxiety disorder: Secondary | ICD-10-CM

## 2021-05-22 MED ORDER — CITALOPRAM HYDROBROMIDE 10 MG PO TABS: 10.0000 mg | ORAL_TABLET | Freq: Every day | ORAL | 0 refills | Status: DC
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
## 2021-05-28 ENCOUNTER — Ambulatory Visit (INDEPENDENT_AMBULATORY_CARE_PROVIDER_SITE_OTHER): Payer: Medicaid Other | Admitting: Licensed Clinical Social Worker

## 2021-05-28 DIAGNOSIS — F411 Generalized anxiety disorder: Secondary | ICD-10-CM

## 2021-05-28 DIAGNOSIS — F331 Major depressive disorder, recurrent, moderate: Secondary | ICD-10-CM
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
## 2021-05-30 ENCOUNTER — Telehealth: Payer: Self-pay

## 2021-05-30 NOTE — Telephone Encounter (Signed)
Patient left a voicemail requesting prescription refill of Hydroxychloroquine.    

## 2021-05-30 NOTE — Telephone Encounter (Signed)
Prescription sent to the pharmacy on 05/02/2021. Patient advised to contact the pharmacy as she should have 2 additional refills. Patient advised to contact the office if she has any trouble getting her prescription filled. Patient advised she also needs to schedule her PLQ eye exam and let us know when it is scheduled for.

## 2021-06-13 ENCOUNTER — Other Ambulatory Visit (HOSPITAL_COMMUNITY): Payer: Self-pay | Admitting: Psychiatry

## 2021-06-20 ENCOUNTER — Ambulatory Visit (HOSPITAL_COMMUNITY): Payer: Medicaid Other | Admitting: Licensed Clinical Social Worker

## 2021-06-20 NOTE — Progress Notes (Signed)
Therapist contacted patient through email, text in MyChart she did not respond session is a no-show.  Noted in session work on past relationship, treatment plan, explore CBT for depression anxiety, provide trauma education look at emotional regulation and trauma book ?

## 2021-06-26 ENCOUNTER — Other Ambulatory Visit: Payer: Self-pay | Admitting: Internal Medicine

## 2021-06-26 DIAGNOSIS — G43709 Chronic migraine without aura, not intractable, without status migrainosus: Secondary | ICD-10-CM

## 2021-06-26 NOTE — Telephone Encounter (Signed)
Next Visit: 07/31/2021 ? ?Last Visit: 05/02/2021 ? ?Last Fill: 05/02/2021 ? ?Current Dose per office note on 05/02/2021: not discussed ? ?Okay to refill Maxalt?   ?

## 2021-07-03 ENCOUNTER — Ambulatory Visit (HOSPITAL_COMMUNITY): Payer: Medicaid Other | Admitting: Psychiatry

## 2021-07-04 ENCOUNTER — Encounter (HOSPITAL_COMMUNITY): Payer: Self-pay

## 2021-07-04 ENCOUNTER — Ambulatory Visit (INDEPENDENT_AMBULATORY_CARE_PROVIDER_SITE_OTHER): Payer: Medicaid Other | Admitting: Licensed Clinical Social Worker

## 2021-07-04 DIAGNOSIS — F411 Generalized anxiety disorder: Secondary | ICD-10-CM | POA: Diagnosis not present

## 2021-07-04 DIAGNOSIS — F331 Major depressive disorder, recurrent, moderate: Secondary | ICD-10-CM | POA: Diagnosis not present

## 2021-07-04 NOTE — Plan of Care (Signed)
Patient participated in treatment plan °

## 2021-07-04 NOTE — Progress Notes (Addendum)
Virtual Visit via Video Note ? ?I connected with Alexandria Russell on 07/04/21 at  3:00 PM EDT by a video enabled telemedicine application and verified that I am speaking with the correct person using two identifiers. ? ?Location: ?Patient: stationary car ?Provider: home office ?  ?I discussed the limitations of evaluation and management by telemedicine and the availability of in person appointments. The patient expressed understanding and agreed to proceed. ? ?I discussed the assessment and treatment plan with the patient. The patient was provided an opportunity to ask questions and all were answered. The patient agreed with the plan and demonstrated an understanding of the instructions. ?  ?The patient was advised to call back or seek an in-person evaluation if the symptoms worsen or if the condition fails to improve as anticipated. ? ?I provided 45 minutes of non-face-to-face time during this encounter. ? ?THERAPIST PROGRESS NOTE ? ?Session Time: 3:00 PM to 3:45 PM ? ?Participation Level: Active ? ?Behavioral Response: CasualAlertAnxious appropriate in session ? ?Type of Therapy: Individual Therapy ? ?Treatment Goals addressed: Anxiety-patient says can shut down in conversations also can be preoccupied worries, depression, trauma ? ?ProgressTowards Goals: Progressing-completed treatment goals today but actively worked on goals by noting sources for her include not being listened to, not knowing what is next for her in life, therapist also educating patient on sources of anxiety for her to better understand her symptoms.  Actively working on sources causing her mental health symptoms ? ?Interventions: CBT, Solution Focused, Strength-based, Supportive, and Other: Coping ? ?Summary: Alexandria Russell is a 21 y.o. female who presents with therapist brought up there was stress around rent. Patient says Mom surgery on stomach out of work only getting some of her paycheck, patient managing and doing side jobs.  Therapist  reviewed where she is working at [employer].  Reviewed treatment plan and discussed anxiety is 1 focus and patient explains questioning herself don't know what path on. Know wants to go back to school so many things have to do before in a position go back to school. 2021 last in school and time going by and thinks could be time wasted.  Patient discussed value of knowledge and learning went on to talk about her older brother occupational health and Scientist, water quality. Brother graduated. 2018 look up to the most, went to [employer] makes things work out. He always finds a way.  Reports discussed some of her strategies for finding a way include perseverance.  Transition to talking about development and patient thinking about her mom that she empathizes she had 3 kids at a young age and some of the ways she acts relates to not having a life outside of raising them. Thinking could have resented fathers and reason she acts the way she did. She is cold and distant emotionally immature. Makes sense patient brain not developing to 28 that perhaps not ready to handle things when younger not mature enough. Empathy for her but wish she would open up more. She sees with her the cycle keeps going and the man she has now no better than before.  Patient transition to talking about some things when upset about something hard to remember in moment jumbled up. Lot of times get that place  dramatic  sometimes can't help.  Like to be in a positive mindset where wouldn't be in negative mindset. Shared she doesn't feel heard or listened to.      ? ?Therapist reviewed symptoms, facilitated expression of thoughts and feelings completed treatment plan  and patient gave consent to complete virtually.  Patient talked about some of her concerns including thought knowing what path she is on therapist provided some context normal course of development includes searching and working on forming identity in early 62s so she is not behind as far as school  people who have gone back to school start working on their careers in 30s 40s perhaps having more time to reflect may end up being helpful so she can figure out what she wants at same time she can always change what she wants to do.  Noted her brother is a role model and therapist provided positive feedback as well models are helpful as guidance and getting [place] to where we want to go, patient shared head could be in a jumble being overdramatic therapist provided some basic neuroscience that when more emotional mind were disconnected from prefrontal white helpful to slow down emotional reaction so our smart brain can be engaged as well as having more options of ways to cope something we can work on.  Patient noted as well negative mindset but also tying this to her not feeling listened to therapist noted therapy will be helpful for her this is one of the important roles in therapy.  Worked on relationship with mom patient gaining more insight why she is the way she is having kids early, may be impacted who she is, repeating same patterns.  Patient is going to continue to work on gaining insight to work on this relationship.  Noted patient's own anxiety comes from not knowing what to say in conversations, things she brings into work where she loses concentration.  We will work on emotional regulation strategies to help her with coping.  Therapist provided active listening open questions supportive interventions ?Suicidal/Homicidal: No ? ?Plan: Return again in 2 weeks.2.  Provide CBT education on depression and anxiety, education on trauma emotional regulation and trauma book ? ?Diagnosis:  Generalized anxiety disorder, major depressive disorder, recurrent, moderate ? ?Collaboration of Care: Other none needed ? ?Patient/Guardian was advised Release of Information must be obtained prior to any record release in order to collaborate their care with an outside provider. Patient/Guardian was advised if they have not already  done so to contact the registration department to sign all necessary forms in order for [place] to release information regarding their care.  ? ?Consent: Patient/Guardian gives verbal consent for treatment and assignment of benefits for services provided during this visit. Patient/Guardian expressed understanding and agreed to proceed.  ? ?Coolidge Breeze, LCSW ?07/04/2021 ? ?

## 2021-07-17 NOTE — Plan of Care (Signed)
Na

## 2021-07-18 ENCOUNTER — Ambulatory Visit (INDEPENDENT_AMBULATORY_CARE_PROVIDER_SITE_OTHER): Payer: Medicaid Other | Admitting: Licensed Clinical Social Worker

## 2021-07-18 DIAGNOSIS — F411 Generalized anxiety disorder: Secondary | ICD-10-CM

## 2021-07-18 DIAGNOSIS — F331 Major depressive disorder, recurrent, moderate: Secondary | ICD-10-CM | POA: Diagnosis not present

## 2021-07-18 NOTE — Progress Notes (Signed)
n
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
## 2021-07-31 ENCOUNTER — Encounter: Payer: Self-pay | Admitting: Internal Medicine

## 2021-07-31 ENCOUNTER — Ambulatory Visit (INDEPENDENT_AMBULATORY_CARE_PROVIDER_SITE_OTHER): Payer: Medicaid Other | Admitting: Internal Medicine

## 2021-07-31 VITALS — BP 116/73 | HR 108 | Resp 14 | Ht 60.0 in | Wt 125.0 lb

## 2021-07-31 DIAGNOSIS — Z79899 Other long term (current) drug therapy: Secondary | ICD-10-CM | POA: Diagnosis not present

## 2021-07-31 DIAGNOSIS — L932 Other local lupus erythematosus: Secondary | ICD-10-CM | POA: Diagnosis not present

## 2021-07-31 NOTE — Progress Notes (Signed)
? ?Office Visit Note ? ?Patient: Alexandria Russell             ?Date of Birth: 2001-04-14           ?MRN: [id number]             ?PCP: Venia Minks, NP ?Referring: Venia Minks, NP ?Visit Date: 07/31/2021 ? ? ?Subjective:  ?Lupus (Doing good) ? ? ?History of Present Illness: Alexandria Russell is a 21 y.o. 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 She has been doing fairly well but with increased pain and stiffness in her upper arms and upper legs in past 2 weeks. She notices mildly tender right sided cervical lymph node swelling painful to lie or turn to that side. She had some upper respiratory symptoms she thinks were seasonal allergies with some drainage. Fatigue is doing well. Still having frequent abdominal pain and feels like the overlying skin change and swelling is no better. ? ?Previous HPI ?05/02/21 ?Alexandria Russell is a 21 y.o. 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 So far not a dramatic change in skin inflammatory changes but face and leg improving, with still areas of raised nodules and recessed skin areas especially on her trunk. No specific intolerance of the HCQ. ? ?Previous HPI ?02/21/21 ?Alexandria Russell is a 21 y.o. 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 She was originally diagnosed about 2 years ago with initial steroid treatment of about 6 months. Primary symptoms at that time include joint pains in multiple sites and skin rashes on the face and nodular skin rashes on her torso and limbs. Diagnosis including skin biopsy taken from the leg and arm and breast. She was treated with imuran and hydroxychloroquine apparently stopped imuran due to nausea and switch to mycophenolate. She stopped taking all medications due to feeling quite sick on the combination and did not follow up regularly for about 2 years. She experienced several flares of symptom  worsening with joint pain and pain over areas of skin swelling and redness usually treated with short term steroid treatments. She moved from [place] area to [place] around the start of this year and established with local physician treated with oral prednisone 5mg  daily at this time partially controlling symptoms. She last had an episode of increased symptoms about 3 weeks ago. She is also taking high dose weekly vitamin d supplementation for low vitamin D. ?Besides the panniculitis symptoms she also reports patchy alopecia on the scalp and on temporal areas associated with posterior cervical adenopathy. She does notice symptoms increased with high amounts of sun exposure. She has lost about 10-20 pounds since this all started but stable during the past year. She denies any raynaud's symptoms or any history of blood clots. ?  ?Labs reviewed ?12/2020 ?Vit D 15.0 ?  ?05/2018 ?ANA 1:160 ?TPMT 18.9 ? ? ?Review of Systems  ?Constitutional:  Positive for fatigue.  ?HENT:  Negative for mouth dryness.   ?Eyes:  Negative for dryness.  ?Respiratory:  Negative for shortness of breath.   ?Cardiovascular:  Positive for swelling in legs/feet.  ?Gastrointestinal:  Negative for constipation.  ?Endocrine: Positive for heat intolerance.  ?Genitourinary:  Negative for difficulty urinating.  ?Musculoskeletal:  Positive for joint pain, joint pain, joint swelling, muscle weakness, morning stiffness and muscle tenderness.  ?Skin:  Negative for rash.  ?Allergic/Immunologic: Positive for susceptible to infections.  ?Neurological:  Positive for headaches and weakness.  ?Hematological:  Positive for bruising/bleeding tendency.  ?Psychiatric/Behavioral:  Positive for sleep disturbance.   ? ?PMFS History:  ?Patient Active Problem List  ? Diagnosis Date Noted  ? Migraine headache 05/02/2021  ? 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 Vitamin D deficiency 02/21/2021  ? High risk medication use 02/21/2021  ?  ?Past Medical History:  ?Diagnosis Date  ?  Lupus (HCC)   ? Vitamin D deficiency   ?  ?Family History  ?Problem Relation Age of Onset  ? Diverticulitis Mother   ? ?Past Surgical History:  ?Procedure Laterality Date  ? SKIN BIOPSY Right   ? ?Social History  ? ?Social History Narrative  ? Not on file  ? ? ?There is no immunization history on file for this patient.  ? ?Objective: ?Vital Signs: BP 116/73 (BP Location: Left Arm, Patient Position: Sitting, Cuff Size: Normal)   Pulse (!) 108   Resp 14   Ht 5' (1.524 m)   Wt 125 lb (56.7 kg)   BMI 24.41 kg/m?   ? ?Physical Exam ?HENT:  ?   Right Ear: External ear normal.  ?   Left Ear: External ear normal.  ?   Mouth/Throat:  ?   Mouth: Mucous membranes are moist.  ?   Pharynx: Oropharynx is clear.  ?Eyes:  ?   Conjunctiva/sclera: Conjunctivae normal.  ?Neck:  ?   Comments: Left sided large anterior cervical and supraclavicular lymph nodes, minimal tenderness ?Cardiovascular:  ?   Rate and Rhythm: Normal rate and regular rhythm.  ?Pulmonary:  ?   Effort: Pulmonary effort is normal.  ?   Breath sounds: Normal breath sounds.  ?Musculoskeletal:  ?   Right lower leg: No edema.  ?   Left lower leg: No edema.  ?Skin: ?   General: Skin is warm and dry.  ?   Comments: Hyperpigmented, slightly recessed skin patches on face, backs of upper arms, abdomen, and on thighs bilaterally ?No palpable nodules no warmth or redness to palpation over tender areas  ?Neurological:  ?   Mental Status: She is alert.  ?Psychiatric:     ?   Mood and Affect: Mood normal.  ?  ? ?Musculoskeletal Exam:  ?Shoulders full ROM no tenderness or swelling ?Elbows full ROM no tenderness or swelling ?Wrists full ROM no tenderness or swelling ?Fingers full ROM no tenderness or swelling ?No hip pain provoked with resisted extension, flexion, or rotation ?Knees full ROM no tenderness or swelling ?Ankles full ROM no tenderness or swelling ? ? ?Investigation: ?No additional findings. ? ?Imaging: ?No results found. ? ?Recent Labs: ?Lab Results  ?Component  Value Date  ? WBC 3.8 (L) 12/29/2020  ? HGB 13.3 12/29/2020  ? PLT 189 12/29/2020  ? NA 136 12/29/2020  ? K 3.8 12/29/2020  ? CL 104 12/29/2020  ? CO2 25 12/29/2020  ? GLUCOSE 75 12/29/2020  ? BUN 10 12/29/2020  ? CREATININE 0.42 (L) 12/29/2020  ? BILITOT 0.7 12/29/2020  ? ALKPHOS 79 12/29/2020  ? AST 25 12/29/2020  ? ALT 18 12/29/2020  ? PROT 8.3 (H) 12/29/2020  ? ALBUMIN 4.2 12/29/2020  ? CALCIUM 9.5 12/29/2020  ? ? ?Speciality Comments: No specialty comments available. ? ?Procedures:  ?No procedures performed ?Allergies: Penicillins  ? ?Assessment / Plan:     ?Visit Diagnoses: Lupus profundus - Plan: Sedimentation rate ? ?No obvious visible skin inflammation active no tenderness but lots of chronic changes and with pain in the proximal leg and arm  areas concerning for some inflammation. Checking sed rate for disease activity monitoring. Plan to start methotrexate as another steroid sparing medication. Can continue HCQ 200 mg if she improves may stop this in future. Plan to stop low dose prednisone at this time. ? ?High risk medication use - Plan: CBC with Differential/Platelet, COMPLETE METABOLIC PANEL WITH GFR, Hepatitis B surface antigen, Hepatitis B core antibody, IgM, Hepatitis C antibody ? ?Plan to start MTX today checking baseline labs CBC, CMP, hepatitis screening. Discussed teratogenicity, need for monitoring for hepatotoxicity or cytopenias. She prefers to start [place] medication due to history of poor GI tolerance of medications. ? ?Orders: ?Orders Placed This Encounter  ?Procedures  ? CBC with Differential/Platelet  ? COMPLETE METABOLIC PANEL WITH GFR  ? Sedimentation rate  ? Hepatitis B surface antigen  ? Hepatitis B core antibody, IgM  ? Hepatitis C antibody  ? ?No orders of the defined types were placed in this encounter. ? ? ? ?Follow-Up Instructions: Return in about 4 weeks (around 08/28/2021) for Lupus profundus MTX start f/u 4wks. ? ? ?Fuller Plan, MD ? ?Note - This record has been created  using [employer].  ?Chart creation errors have been sought, but may not always  ?have been located. Such creation errors do not reflect on  ?the standard of medical care. ? ?

## 2021-07-31 NOTE — Patient Instructions (Addendum)
Methotrexate Injection ?What is this medication? ?METHOTREXATE (METH oh TREX ate) treats inflammatory conditions such as arthritis and psoriasis. It works by decreasing inflammation, which can reduce pain and prevent long-term injury to the joints and skin. It may also be used to treat some types of cancer. It works by slowing down the growth of cancer cells. ?This medicine may be used for other purposes; ask your health care provider or pharmacist if you have questions. ?What should I tell my care team before I take this medication? ?They need to know if you have any of these conditions: ?Fluid in the stomach area or lungs ?If you often drink alcohol ?Infection or immune system problems ?Kidney disease ?Liver disease ?Low blood counts (white cells, platelets, or red blood cells) ?Lung disease ?Recent or ongoing radiation ?Recent or upcoming vaccine ?Stomach ulcers ?Ulcerative colitis ?An unusual or allergic reaction to methotrexate, other medications, foods, dyes, or preservatives ?Pregnant or trying to get pregnant ?Breast-feeding ?How should I use this medication? ?This medication is for infusion into a vein or for injection into muscle or into the spinal fluid (whichever applies). It is usually given in a hospital or clinic setting. ?In rare cases, you might get this medication at home. You will be taught how to give this medication. Use exactly as directed. Take your medication at regular intervals. Do not take your medication more often than directed. ?If this medication is used for arthritis or psoriasis, it should be taken weekly, NOT daily. ?It is important that you put your used needles and syringes in a special sharps container. Do not put them in a trash can. If you do not have a sharps container, call your pharmacist or care team to get one. ?Talk to your care team about the use of this medication in children. While this medication may be prescribed for children as young as 2 years for selected  conditions, precautions do apply. ?Overdosage: If you think you have taken too much of this medicine contact a poison control center or emergency room at once. ?NOTE: This medicine is only for you. Do not share this medicine with others. ?What if I miss a dose? ?It is important not to miss your dose. Call your care team if you are unable to keep an appointment. If you give yourself the medication, and you miss a dose, talk with your care team. Do not take double or extra doses. ?What may interact with this medication? ?Do not take this medication with any of the following: ?Acitretin ?This medication may also interact with the following: ?Aspirin or aspirin-like medications including salicylates ?Azathioprine ?Certain antibiotics like chloramphenicol, penicillin, tetracycline ?Certain medications that treat or prevent blood clots like warfarin, apixaban, dabigatran, and rivaroxaban ?Certain medications for stomach problems like esomeprazole, omeprazole, pantoprazole ?Cyclosporine ?Dapsone ?Diuretics ?Folic acid ?Gold ?Hydroxychloroquine ?Live virus vaccines ?Medications for infection like acyclovir, adefovir, amphotericin B, bacitracin, cidofovir, foscarnet, ganciclovir, gentamicin, pentamidine, vancomycin ?Mercaptopurine ?NSAIDs, medications for pain and inflammation, like ibuprofen or naproxen ?Pamidronate ?Pemetrexed ?Penicillamine ?Phenylbutazone ?Phenytoin ?Probenecid ?Pyrimethamine ?Retinoids such as isotretinoin and tretinoin ?Steroid medications like prednisone or cortisone ?Sulfonamides like sulfasalazine and trimethoprim/sulfamethoxazole ?Theophylline ?Zoledronic acid ?This list may not describe all possible interactions. Give your health care provider a list of all the medicines, herbs, non-prescription drugs, or dietary supplements you use. Also tell them if you smoke, drink alcohol, or use illegal drugs. Some items may interact with your medicine. ?What should I watch for while using this  medication? ?This medication may make you feel   generally unwell. This is not uncommon as chemotherapy can affect healthy cells as well as cancer cells. Report any side effects. Continue your course of treatment even though you feel ill unless your care team tells you to stop. ?Your condition will be monitored carefully while you are receiving this medication. ?Avoid alcoholic drinks. ?This medication can cause serious side effects. To reduce the risk, your care team may give you other medications to take before receiving this one. Be sure to follow the directions from your care team. ?This medication can make you more sensitive to the sun. Keep out of the sun. If you cannot avoid being in the sun, wear protective clothing and use sunscreen. Do not use sun lamps or tanning beds/booths. ?You may get drowsy or dizzy. Do not drive, use machinery, or do anything that needs mental alertness until you know how this medication affects you. Do not stand or sit up quickly, especially if you are an older patient. This reduces the risk of dizzy or fainting spells. ?You may need blood work while you are taking this medication. ?Call your care team for advice if you get a fever, chills or sore throat, or other symptoms of a cold or flu. Do not treat yourself. This medication decreases your body's ability to fight infections. Try to avoid being around people who are sick. ?This medication may increase your risk to bruise or bleed. Call your care team if you notice any unusual bleeding. ?Be careful brushing or flossing your teeth or using a toothpick because you may get an infection or bleed more easily. If you have any dental work done, tell your dentist you are receiving this medication ?Check with your care team if you get an attack of severe diarrhea, nausea and vomiting, or if you sweat a lot. The loss of too much body fluid can make it dangerous for you to take this medication. ?Talk to your care team about your risk of  cancer. You may be more at risk for certain types of cancers if you take this medication. ?Do not become pregnant while taking this medication or for 6 months after stopping it. Women should inform their care team if they wish to become pregnant or think they might be pregnant. Men should not father a child while taking this medication and for 3 months after stopping it. There is potential for serious harm to an unborn child. Talk to your care team for more information. Do not breast-feed an infant while taking this medication or for 1 week after stopping it. ?This medication may make it more difficult to get pregnant or father a child. Talk to your care team if you are concerned about your fertility. ?What side effects may I notice from receiving this medication? ?Side effects that you should report to your care team as soon as possible: ?Allergic reactions--skin rash, itching, hives, swelling of the face, lips, tongue, or throat ?Blood clot--pain, swelling, or warmth in the leg, shortness of breath, chest pain ?Dry cough, shortness of breath or trouble breathing ?Infection--fever, chills, cough, sore throat, wounds that don't heal, pain or trouble when passing urine, general feeling of discomfort or being unwell ?Kidney injury--decrease in the amount of urine, swelling of the ankles, hands, or feet ?Liver injury--right upper belly pain, loss of appetite, nausea, light-colored stool, dark yellow or brown urine, yellowing of the skin or eyes, unusual weakness or fatigue ?Low red blood cell count--unusual weakness or fatigue, dizziness, headache, trouble breathing ?Redness, blistering, peeling, or loosening   of the skin, including inside the mouth ?Seizures ?Unusual bruising or bleeding ?Side effects that usually do not require medical attention (report to your care team if they continue or are bothersome): ?Diarrhea ?Dizziness ?Hair loss ?Nausea ?Pain, redness, or swelling with sores inside the mouth or  throat ?Vomiting ?This list may not describe all possible side effects. Call your doctor for medical advice about side effects. You may report side effects to FDA at 1-800-FDA-1088. ?Where should I keep my medication? ?This medication is gi

## 2021-08-01 LAB — COMPLETE METABOLIC PANEL WITH GFR
AG Ratio: 1.4 (calc) (ref 1.0–2.5)
ALT: 10 U/L (ref 6–29)
AST: 21 U/L (ref 10–30)
Albumin: 4.5 g/dL (ref 3.6–5.1)
Alkaline phosphatase (APISO): 76 U/L (ref 31–125)
BUN: 7 mg/dL (ref 7–25)
CO2: 29 mmol/L (ref 20–32)
Calcium: 9.5 mg/dL (ref 8.6–10.2)
Chloride: 101 mmol/L (ref 98–110)
Creat: 0.54 mg/dL (ref 0.50–0.96)
Globulin: 3.3 g/dL (calc) (ref 1.9–3.7)
Glucose, Bld: 72 mg/dL (ref 65–99)
Potassium: 3.9 mmol/L (ref 3.5–5.3)
Sodium: 139 mmol/L (ref 135–146)
Total Bilirubin: 0.5 mg/dL (ref 0.2–1.2)
Total Protein: 7.8 g/dL (ref 6.1–8.1)
eGFR: 135 mL/min/{1.73_m2} (ref >=60)

## 2021-08-01 LAB — CBC WITH DIFFERENTIAL/PLATELET
Absolute Monocytes: 439 cells/uL (ref 200–950)
Basophils Absolute: 31 cells/uL (ref 0–200)
Basophils Relative: 0.3 %
Eosinophils Absolute: 20 cells/uL (ref 15–500)
Eosinophils Relative: 0.2 %
HCT: 42.2 % (ref 35.0–45.0)
Hemoglobin: 13.8 g/dL (ref 11.7–15.5)
Lymphs Abs: 1367 cells/uL (ref 850–3900)
MCH: 27.9 pg (ref 27.0–33.0)
MCHC: 32.7 g/dL (ref 32.0–36.0)
MCV: 85.3 fL (ref 80.0–100.0)
MPV: 10.6 fL (ref 7.5–12.5)
Monocytes Relative: 4.3 %
Neutro Abs: 8344 cells/uL — ABNORMAL HIGH (ref 1500–7800)
Neutrophils Relative %: 81.8 %
Platelets: 224 10*3/uL (ref 140–400)
RBC: 4.95 10*6/uL (ref 3.80–5.10)
RDW: 13.4 % (ref 11.0–15.0)
Total Lymphocyte: 13.4 %
WBC: 10.2 10*3/uL (ref 3.8–10.8)

## 2021-08-01 LAB — SEDIMENTATION RATE: Sed Rate: 9 mm/h (ref 0–20)

## 2021-08-01 LAB — HEPATITIS B CORE ANTIBODY, IGM: Hep B C IgM: NONREACTIVE

## 2021-08-01 LAB — HEPATITIS B SURFACE ANTIGEN: Hepatitis B Surface Ag: NONREACTIVE

## 2021-08-01 LAB — HEPATITIS C ANTIBODY
Hepatitis C Ab: NONREACTIVE
SIGNAL TO CUT-OFF: 0.15 (ref <1.00)

## 2021-08-02 ENCOUNTER — Telehealth: Payer: Self-pay | Admitting: Internal Medicine

## 2021-08-02 NOTE — Telephone Encounter (Signed)
Patient called the office stating she was supposed to have Methotrexate sent in after her labs came back and she saw them in MyChart. Patient would like to know when that prescription will be sent in. ?

## 2021-08-03 MED ORDER — "TUBERCULIN SYRINGE 26G X 3/8"" 1 ML MISC": 0 refills | Status: DC

## 2021-08-03 MED ORDER — METHOTREXATE SODIUM CHEMO INJECTION 50 MG/2ML
15.0000 mg | INTRAMUSCULAR | 1 refills | Status: DC
Start: 2021-08-03 — End: 2021-08-29

## 2021-08-03 NOTE — Telephone Encounter (Signed)
Patient advised lab results are back and look normal so no reason against starting methotrexate. Rx sent now for medication and syringes, 0.6 mL once weekly as discussed. ?

## 2021-08-03 NOTE — Telephone Encounter (Signed)
Lab results are back and look normal so no reason against starting methotrexate. Rx sent now for medication and syringes, 0.6 mL once weekly as discussed.

## 2021-08-03 NOTE — Addendum Note (Signed)
Addended by: Collier Salina on: 08/03/2021 01:14 AM ? ? Modules accepted: Orders ? ?

## 2021-08-20 NOTE — Progress Notes (Signed)
Office Visit Note  Patient: Alexandria Russell             Date of Birth: 2000/05/01           MRN: [id number]             PCP: Venia Minks, NP Referring: Venia Minks, NP Visit Date: 08/29/2021   Subjective:  History of Present Illness: Alexandria Russell is a 21 y.o. 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 She has some ongoing pain in the backs of her legs in previously affected areas. She notices some pain between the thumb and index finger of both hands without visible changes. Her back pain and muscle spasm improved with as needed use of flexeril. She is scheduled for EGD/colonoscopy tomorrow for dysphagia symptoms.  Previous HPI 07/31/2021 Alexandria Russell is a 21 y.o. 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 She has been doing fairly well but with increased pain and stiffness in her upper arms and upper legs in past 2 weeks. She notices mildly tender right sided cervical lymph node swelling painful to lie or turn to that side. She had some upper respiratory symptoms she thinks were seasonal allergies with some drainage. Fatigue is doing well. Still having frequent abdominal pain and feels like the overlying skin change and swelling is no better.   Previous HPI 05/02/21 Alexandria Russell is a 21 y.o. 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 So far not a dramatic change in skin inflammatory changes but face and leg improving, with still areas of raised nodules and recessed skin areas especially on her trunk. No specific intolerance of the HCQ.   Previous HPI 02/21/21 Alexandria Russell is a 21 y.o. 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 She was originally diagnosed about 2 years ago with initial steroid treatment of about 6  months. Primary symptoms at that time include joint pains in multiple sites and skin rashes on the face and nodular skin rashes on her torso and limbs. Diagnosis including skin biopsy taken from the leg and arm and breast. She was treated with imuran and hydroxychloroquine apparently stopped imuran due to nausea and switch to mycophenolate. She stopped taking all medications due to feeling quite sick on the combination and did not follow up regularly for about 2 years. She experienced several flares of symptom worsening with joint pain and pain over areas of skin swelling and redness usually treated with short term steroid treatments. She moved from [place] area to [place] around the start of this year and established with local physician treated with oral prednisone 5mg  daily at this time partially controlling symptoms. She last had an episode of increased symptoms about 3 weeks ago. She is also taking high dose weekly vitamin d supplementation for low vitamin D. Besides the panniculitis symptoms she also reports patchy alopecia on the scalp and on temporal areas associated with posterior cervical adenopathy. She does notice symptoms increased with high amounts of sun exposure. She has lost about 10-20 pounds since this all started but stable during the past year. She denies any raynaud's symptoms or any history of blood clots.   Labs reviewed 12/2020 Vit D 15.0   05/2018 ANA 1:160 TPMT 18.9     Review of  Systems  Constitutional:  Negative for fatigue.  HENT:  Negative for mouth dryness.   Eyes:  Negative for dryness.  Respiratory:  Negative for shortness of breath.   Cardiovascular:  Positive for swelling in legs/feet.  Gastrointestinal:  Negative for constipation.  Endocrine: Positive for heat intolerance.  Genitourinary:  Negative for difficulty urinating.  Musculoskeletal:  Positive for joint pain, joint pain, joint swelling, muscle weakness, morning stiffness and muscle tenderness.   Skin:  Negative for rash.  Allergic/Immunologic: Positive for susceptible to infections.  Neurological:  Negative for numbness.  Hematological:  Negative for bruising/bleeding tendency.  Psychiatric/Behavioral:  Negative for sleep disturbance.    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   Objective: Vital Signs: BP 109/70 (BP Location: Left Arm, Patient Position: Sitting, Cuff Size: Small)   Pulse (!) 101   Resp 12   Ht 5' (1.524 m)   Wt 122 lb 3.2 oz (55.4 kg)   BMI 23.87 kg/m    Physical Exam Eyes:     Conjunctiva/sclera: Conjunctivae normal.  Neck:     Comments: Palpable nontender right anterior cervical lymph node Cardiovascular:     Rate and Rhythm: Normal rate and regular rhythm.  Pulmonary:     Effort: Pulmonary effort is normal.     Breath sounds: Normal breath sounds.  Musculoskeletal:     Right lower leg: No edema.     Left lower leg: No edema.  Skin:    General: Skin is warm and dry.     Comments: Subcutaneous indurated nodule in cheek, recessed areas in upper arm, upper leg front and back, no discoloration no erythema  Neurological:     Mental Status: She is alert.  Psychiatric:        Mood and Affect: Mood normal.     Musculoskeletal Exam:  Shoulders full ROM no tenderness or swelling Elbows full ROM no tenderness or swelling Wrists full ROM no tenderness or swelling Fingers full ROM no tenderness or  swelling Mild tenderness to pressure in back of thighs, at areas with recessed or atrophic subcutaneous layer Knees full ROM no tenderness or swelling Ankles full ROM no tenderness or swelling   Investigation: No additional findings.  Imaging: No results found.  Recent Labs: Lab Results  Component Value Date   WBC 10.2 07/31/2021   HGB 13.8 07/31/2021   PLT 224 07/31/2021   NA 139 07/31/2021   K 3.9 07/31/2021   CL 101 07/31/2021   CO2 29 07/31/2021   GLUCOSE 72 07/31/2021   BUN 7 07/31/2021   CREATININE 0.54 07/31/2021   BILITOT 0.5 07/31/2021   ALKPHOS 79 12/29/2020   AST 21 07/31/2021   ALT 10 07/31/2021   PROT 7.8 07/31/2021   ALBUMIN 4.2 12/29/2020   CALCIUM 9.5 07/31/2021    Speciality Comments: PLQ Eye Exam: 08/21/2021 WNL @ [facility] Follow up in 1 month VF/OCT  Procedures:  No procedures performed Allergies: Penicillins   Assessment / Plan:     Visit Diagnoses: Lupus profundus - Plan: methotrexate 50 MG/2ML injection, Sedimentation rate  Suspect some ongoing inflammation considering the pain and tenderness at involved sites on upper legs.  We will recheck sedimentation rate.  She is curious about what else can be done regarding the chronic skin changes and discoloration or any other local treatments.  Recommend referral to dermatology for additional recommendations on this.  Continuing the hydroxychloroquine 200 mg and methotrexate 15 mg subcu and folic acid 1 mg daily.  High risk medication use - plaquenil 200mg  po qd and methotrexate 0.286mL once weekly.  - Plan: CBC with Differential/Platelet, COMPLETE METABOLIC PANEL WITH GFR  Checking CBC and CMP for methotrexate toxicity monitoring after new medication start.  Recent Pl[facility]enil eye exam on 5/9 [facility]  Chronic migraine without aura without status migrainosus, not intractable - Plan: rizatriptan (MAXALT) 5 MG tablet  Doing better with the Maxalt then with NSAIDs but has some  response on both.  New Rx for Maxalt 5 mg as needed for migraine.  Muscle spasm - Plan: cyclobenzaprine (FLEXERIL) 5 MG tablet  Getting some improvement for back muscle pain or spasticity with intermittent use at night.  Continue Flexeril 5 mg nightly as needed.  Orders: Orders Placed This Encounter  Procedures   Sedimentation rate   CBC with Differential/Platelet   COMPLETE METABOLIC PANEL WITH GFR   Meds ordered this encounter  Medications   cyclobenzaprine (FLEXERIL) 5 MG tablet    Sig: Take 1 tablet (5 mg total) by mouth at bedtime as needed for muscle spasms.    Dispense:  30 tablet    Refill:  1   methotrexate 50 MG/2ML injection    Sig: Inject 0.6 mLs (15 mg total) into the skin once a week.    Dispense:  4 mL    Refill:  2   rizatriptan (MAXALT) 5 MG tablet    Sig: TAKE 1 TABLET BY MOUTH AS NEEDED FOR MIGRAINE. MAY REPEAT IN 2 HOURS IF NEEDED    Dispense:  10 tablet    Refill:  1     Follow-Up Instructions: Return in about 3 months (around 11/29/2021) for Lupus profundus MTX/HCQ f/u 3mos.   Fuller Planhristopher W Daveigh Batty, MD  Note - This record has been created using [employer] software.  Chart creation errors have been sought, but may not always  have been located. Such creation errors do not reflect on  the standard of medical care.

## 2021-08-24 ENCOUNTER — Other Ambulatory Visit: Payer: Self-pay | Admitting: Internal Medicine

## 2021-08-24 DIAGNOSIS — L932 Other local lupus erythematosus: Secondary | ICD-10-CM

## 2021-08-24 NOTE — Telephone Encounter (Signed)
Next Visit: 08/29/2021 ? ?Last Visit: 07/31/2021 ? ?Labs: 07/31/2021 Neutro Abs. 8,344 ? ?Eye exam: 08/21/2021 WNL  ? ?Current Dose per office note 07/31/2021: HCQ 200 mg ? ?DX: Lupus profundus  ? ?Last Fill: 05/02/2021 ? ?Okay to refill Plaquenil?  ?

## 2021-08-29 ENCOUNTER — Ambulatory Visit (INDEPENDENT_AMBULATORY_CARE_PROVIDER_SITE_OTHER): Payer: Medicaid Other | Admitting: Internal Medicine

## 2021-08-29 ENCOUNTER — Encounter: Payer: Self-pay | Admitting: *Deleted

## 2021-08-29 ENCOUNTER — Encounter: Payer: Self-pay | Admitting: Internal Medicine

## 2021-08-29 VITALS — BP 109/70 | HR 101 | Resp 12 | Ht 60.0 in | Wt 122.2 lb

## 2021-08-29 DIAGNOSIS — L932 Other local lupus erythematosus: Secondary | ICD-10-CM

## 2021-08-29 DIAGNOSIS — G43709 Chronic migraine without aura, not intractable, without status migrainosus: Secondary | ICD-10-CM | POA: Diagnosis not present

## 2021-08-29 DIAGNOSIS — Z79899 Other long term (current) drug therapy: Secondary | ICD-10-CM | POA: Diagnosis not present

## 2021-08-29 DIAGNOSIS — M62838 Other muscle spasm: Secondary | ICD-10-CM

## 2021-08-29 MED ORDER — CYCLOBENZAPRINE HCL 5 MG PO TABS: 5.0000 mg | ORAL_TABLET | Freq: Every evening | ORAL | 1 refills | Status: DC | PRN

## 2021-08-29 MED ORDER — METHOTREXATE SODIUM CHEMO INJECTION 50 MG/2ML: 15.0000 mg | INTRAMUSCULAR | 2 refills | Status: DC

## 2021-08-29 MED ORDER — RIZATRIPTAN BENZOATE 5 MG PO TABS: ORAL_TABLET | ORAL | 1 refills | Status: DC

## 2021-08-30 ENCOUNTER — Other Ambulatory Visit: Payer: Self-pay | Admitting: *Deleted

## 2021-08-30 ENCOUNTER — Encounter: Payer: Self-pay | Admitting: Internal Medicine

## 2021-08-30 DIAGNOSIS — L932 Other local lupus erythematosus: Secondary | ICD-10-CM

## 2021-08-30 LAB — COMPLETE METABOLIC PANEL WITH GFR
AG Ratio: 1.5 (calc) (ref 1.0–2.5)
ALT: 9 U/L (ref 6–29)
AST: 19 U/L (ref 10–30)
Albumin: 4.6 g/dL (ref 3.6–5.1)
Alkaline phosphatase (APISO): 76 U/L (ref 31–125)
BUN: 8 mg/dL (ref 7–25)
CO2: 26 mmol/L (ref 20–32)
Calcium: 9.3 mg/dL (ref 8.6–10.2)
Chloride: 104 mmol/L (ref 98–110)
Creat: 0.54 mg/dL (ref 0.50–0.96)
Globulin: 3.1 g/dL (calc) (ref 1.9–3.7)
Glucose, Bld: 64 mg/dL — ABNORMAL LOW (ref 65–99)
Potassium: 4.3 mmol/L (ref 3.5–5.3)
Sodium: 141 mmol/L (ref 135–146)
Total Bilirubin: 0.6 mg/dL (ref 0.2–1.2)
Total Protein: 7.7 g/dL (ref 6.1–8.1)
eGFR: 134 mL/min/{1.73_m2} (ref >=60)

## 2021-08-30 LAB — CBC WITH DIFFERENTIAL/PLATELET
Absolute Monocytes: 333 cells/uL (ref 200–950)
Basophils Absolute: 19 cells/uL (ref 0–200)
Basophils Relative: 0.3 %
Eosinophils Absolute: 32 cells/uL (ref 15–500)
Eosinophils Relative: 0.5 %
HCT: 40.6 % (ref 35.0–45.0)
Hemoglobin: 13.5 g/dL (ref 11.7–15.5)
Lymphs Abs: 2182 cells/uL (ref 850–3900)
MCH: 28.8 pg (ref 27.0–33.0)
MCHC: 33.3 g/dL (ref 32.0–36.0)
MCV: 86.6 fL (ref 80.0–100.0)
MPV: 10.1 fL (ref 7.5–12.5)
Monocytes Relative: 5.2 %
Neutro Abs: 3834 cells/uL (ref 1500–7800)
Neutrophils Relative %: 59.9 %
Platelets: 209 10*3/uL (ref 140–400)
RBC: 4.69 10*6/uL (ref 3.80–5.10)
RDW: 14.1 % (ref 11.0–15.0)
Total Lymphocyte: 34.1 %
WBC: 6.4 10*3/uL (ref 3.8–10.8)

## 2021-08-30 LAB — SEDIMENTATION RATE: Sed Rate: 9 mm/h (ref 0–20)

## 2021-08-30 NOTE — Telephone Encounter (Signed)
Please send a referral to this group. Reason is lupus profundus.

## 2021-08-30 NOTE — Telephone Encounter (Signed)
Referral placed.

## 2021-08-30 NOTE — Progress Notes (Signed)
Lab results look fine for continuing the methotrexate.

## 2021-09-28 ENCOUNTER — Other Ambulatory Visit: Payer: Self-pay | Admitting: Internal Medicine

## 2021-09-28 DIAGNOSIS — L932 Other local lupus erythematosus: Secondary | ICD-10-CM

## 2021-09-28 NOTE — Telephone Encounter (Signed)
Next Visit: 11/27/2021  Last Visit: 08/29/2021  Last Fill: 08/29/2021 (Patient given PF vial from pharmacy)  DX: Lupus profundus   Current Dose per office note 08/29/2021: methotrexate 15 mg subcu   Labs: 08/29/2021 Lab results look fine for continuing the methotrexate.  Okay to refill MTX?

## 2021-10-04 ENCOUNTER — Other Ambulatory Visit: Payer: Self-pay | Admitting: Internal Medicine

## 2021-10-04 ENCOUNTER — Telehealth: Payer: Self-pay | Admitting: Internal Medicine

## 2021-10-04 DIAGNOSIS — L932 Other local lupus erythematosus: Secondary | ICD-10-CM

## 2021-10-04 NOTE — Telephone Encounter (Signed)
See Rx note for details. MTX on backorder and patient requesting other options. Request sent to Dr. Dimple Casey.

## 2021-10-04 NOTE — Telephone Encounter (Signed)
Patient called the office stating she received a message from her pharmacy stating they were reaching out to our office for an alternative for her injectable MTX. Patient states she needs a refill ASAP because she does not want to go without. Patient states she can take an oral version if she can.

## 2021-10-04 NOTE — Telephone Encounter (Signed)
Attempted to contact the patient and left message for patient to call the office.  

## 2021-10-04 NOTE — Telephone Encounter (Signed)
Patient called stating she was returning Andrea's call.   °

## 2021-10-04 NOTE — Telephone Encounter (Signed)
See previous phone note and rx note for details.

## 2021-10-05 ENCOUNTER — Telehealth: Payer: Self-pay | Admitting: Internal Medicine

## 2021-10-05 MED ORDER — METHOTREXATE 2.5 MG PO TABS: 15.0000 mg | ORAL_TABLET | ORAL | 0 refills | Status: DC

## 2021-10-05 NOTE — Telephone Encounter (Signed)
Patient returned call to the office and she is in agreement with taking the oral MTX.

## 2021-11-15 NOTE — Progress Notes (Signed)
Office Visit Note  Patient: Alexandria Russell             Date of Birth: [birth year]           MRN: [id number]             PCP: Venia Minks, NP Referring: Venia Minks, NP Visit Date: 11/27/2021   Subjective:  Follow-up (Migraines worse, more pain and swelling in arms legs and wrists and hands. )   History of Present Illness: Alexandria Russell is a 21 y.o. 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  Since her last visit she is having some increased symptoms in multiple areas.  Has increased joint pain affecting hands and wrists most commonly after she exerts herself more heavily such as having to move and lift a lot of objects for work.  She is having ongoing trouble with migraine headaches these are associated with light sensitivity and gets pain lateral to the eye sometimes affects both sides.  She was treated for asymptomatic chlamydia infection detected on STI screening in June finished antibiotics for this completely.  We had to switch her to oral methotrexate due to unavailability of the subcutaneous dosing.  She is reporting lower abdominal pain and GI intolerance worse for up to about 3 days of the week when taking her methotrexate.  Previous HPI 08/29/2021 Alexandria Russell is a 21 y.o. 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 She has some ongoing pain in the backs of her legs in previously affected areas. She notices some pain between the thumb and index finger of both hands without visible changes. Her back pain and muscle spasm improved with as needed use of flexeril. She is scheduled for EGD/colonoscopy tomorrow for dysphagia symptoms.   Previous HPI 07/31/2021 Alexandria Russell is a 21 y.o. 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 She has been doing fairly well  but with increased pain and stiffness in her upper arms and upper legs in past 2 weeks. She notices mildly tender right sided cervical lymph node swelling painful to lie or turn to that side. She had some upper respiratory symptoms she thinks were seasonal allergies with some drainage. Fatigue is doing well. Still having frequent abdominal pain and feels like the overlying skin change and swelling is no better.   Previous HPI 05/02/21 Alexandria Russell is a 21 y.o. 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 So far not a dramatic change in skin inflammatory changes but face and leg improving, with still areas of raised nodules and recessed skin areas especially on her trunk. No specific intolerance of the HCQ.   Previous HPI 02/21/21 Alexandria Russell is a 21 y.o. 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 She was originally diagnosed about 2 years ago with initial steroid treatment of about 6 months. Primary symptoms at that time include joint pains in multiple sites and skin rashes on the face and nodular skin rashes on her torso and limbs. Diagnosis including skin biopsy taken from the leg and arm and breast. She was treated with imuran and hydroxychloroquine apparently stopped imuran due to nausea and switch to mycophenolate. She stopped taking all medications due to feeling quite sick on the combination and did not follow up  regularly for about 2 years. She experienced several flares of symptom worsening with joint pain and pain over areas of skin swelling and redness usually treated with short term steroid treatments. She moved from [place] area to [place] around the start of this year and established with local physician treated with oral prednisone 5mg  daily at this time partially controlling symptoms. She last had an episode of increased symptoms about 3 weeks ago. She is also taking high dose weekly vitamin d supplementation for low  vitamin D. Besides the panniculitis symptoms she also reports patchy alopecia on the scalp and on temporal areas associated with posterior cervical adenopathy. She does notice symptoms increased with high amounts of sun exposure. She has lost about 10-20 pounds since this all started but stable during the past year. She denies any raynaud's symptoms or any history of blood clots.   Labs reviewed 12/2020 Vit D 15.0   05/2018 ANA 1:160 TPMT 18.9    Review of Systems  Constitutional:  Negative for fatigue.  HENT:  Negative for mouth sores and mouth dryness.   Eyes:  Negative for dryness.  Respiratory:  Negative for shortness of breath.   Cardiovascular:  Negative for chest pain and palpitations.  Gastrointestinal:  Negative for blood in stool, constipation and diarrhea.  Endocrine: Negative for increased urination.  Genitourinary:  Negative for involuntary urination.  Musculoskeletal:  Positive for joint pain, gait problem, joint pain, joint swelling, myalgias, muscle weakness, morning stiffness, muscle tenderness and myalgias.  Skin:  Positive for sensitivity to sunlight. Negative for color change, rash and hair loss.  Allergic/Immunologic: Positive for susceptible to infections.  Neurological:  Positive for headaches. Negative for dizziness.  Hematological:  Negative for swollen glands.  Psychiatric/Behavioral:  Positive for sleep disturbance. Negative for depressed mood. The patient is nervous/anxious.     PMFS History:  Patient Active Problem List   Diagnosis Date Noted   Muscle spasm 08/29/2021   Migraine headache 05/02/2021   Lupus profundus 02/21/2021   Vitamin D deficiency 02/21/2021   High risk medication use 02/21/2021    Past Medical History:  Diagnosis Date   Lupus (HCC)    Vitamin D deficiency     Family History  Problem Relation Age of Onset   Diverticulitis Mother    Past Surgical History:  Procedure Laterality Date   SKIN BIOPSY Right    Social History    Social History Narrative   Not on file    There is no immunization history on file for this patient.   Objective: Vital Signs: BP 99/65 (BP Location: Right Arm, Patient Position: Sitting, Cuff Size: Normal)   Pulse 86   Resp 14   Ht 5' (1.524 m)   Wt 121 lb (54.9 kg)   LMP 10/26/2021   BMI 23.63 kg/m    Physical Exam Neck:     Comments: Mildly enlarged anterior cervical lymph nodes on both sides with no tenderness Cardiovascular:     Rate and Rhythm: Normal rate and regular rhythm.  Pulmonary:     Effort: Pulmonary effort is normal.     Breath sounds: Normal breath sounds.  Lymphadenopathy:     Cervical: Cervical adenopathy present.  Skin:    General: Skin is warm and dry.     Comments: Patchy areas with atrophy or absence of subcutaneous fat layer affecting lateral upper arms, abdomen around umbilicus, and at medial thigh with minimal tenderness no warmth or discoloration  Neurological:     Mental Status: She is alert.  Psychiatric:        Mood and Affect: Mood normal.      Musculoskeletal Exam:  Shoulders full ROM no tenderness or swelling Elbows full ROM no tenderness or swelling Wrists full ROM no tenderness or swelling Fingers full ROM no tenderness or swelling Knees full ROM no tenderness or swelling Ankles full ROM no tenderness or swelling   Investigation: No additional findings.  Imaging: No results found.  Recent Labs: Lab Results  Component Value Date   WBC 5.2 11/27/2021   HGB 13.8 11/27/2021   PLT 198 11/27/2021   NA 140 11/27/2021   K 4.1 11/27/2021   CL 104 11/27/2021   CO2 27 11/27/2021   GLUCOSE 53 (L) 11/27/2021   BUN 8 11/27/2021   CREATININE 0.77 11/27/2021   BILITOT 0.5 11/27/2021   ALKPHOS 79 12/29/2020   AST 19 11/27/2021   ALT 9 11/27/2021   PROT 7.4 11/27/2021   ALBUMIN 4.2 12/29/2020   CALCIUM 9.3 11/27/2021    Speciality Comments: PLQ Eye Exam: 08/21/2021 WNL @ [facility] Follow up in 1 month  VF/OCT  Procedures:  No procedures performed Allergies: Penicillins   Assessment / Plan:     Visit Diagnoses: Lupus profundus - Plan: Sedimentation rate  I do not see any new or definitively active skin lesions or inflammation on exam today.  No new mucosal ulceration.  Not sure whether her arthralgias could be disease related or use.  Checking sedimentation rate for disease activity monitoring.  The trouble she has had with oral methotrexate and difficulty accessing subcu medication I think she could benefit with a switch would recommend azathioprine.  In the meantime can continue hydroxychloroquine 200 mg daily.  High risk medication use - plaquenil 200mg  po qd and methotrexate 0.53mL once weekly.  - Plan: CBC with Differential/Platelet, COMPLETE METABOLIC PANEL WITH GFR, Thiopurine methyltransferase(tpmt)rbc  Checking CBC and CMP for medication toxicity monitoring.  Discussed possibly switching to azathioprine for better medication tolerance so checking TPMT phenotype today.  No concerning findings on ophthalmology follow-up in May.  Chronic migraine without aura without status migrainosus, not intractable - Plan: Ambulatory referral to Neurology  We will refer to neurology for migraine headache assessment and management.  Symptoms are not being very well controlled reportedly with the current rizatriptan prescription and I am not sure these are directly related to her autoimmune disease.   Orders: Orders Placed This Encounter  Procedures   Sedimentation rate   CBC with Differential/Platelet   COMPLETE METABOLIC PANEL WITH GFR   Thiopurine methyltransferase(tpmt)rbc   Ambulatory referral to Neurology   No orders of the defined types were placed in this encounter.    Follow-Up Instructions: Return in about 3 months (around 02/27/2022) for Lupus profundus HCQ/AZA swtch f/u 63mos.   Collier Salina, MD  Note - This record has been created using [employer].  Chart creation  errors have been sought, but may not always  have been located. Such creation errors do not reflect on  the standard of medical care.

## 2021-11-27 ENCOUNTER — Ambulatory Visit: Payer: Medicaid Other | Attending: Internal Medicine | Admitting: Internal Medicine

## 2021-11-27 ENCOUNTER — Encounter: Payer: Self-pay | Admitting: Internal Medicine

## 2021-11-27 VITALS — BP 99/65 | HR 86 | Resp 14 | Ht 60.0 in | Wt 121.0 lb

## 2021-11-27 DIAGNOSIS — Z79899 Other long term (current) drug therapy: Secondary | ICD-10-CM

## 2021-11-27 DIAGNOSIS — G43709 Chronic migraine without aura, not intractable, without status migrainosus: Secondary | ICD-10-CM

## 2021-11-27 DIAGNOSIS — M62838 Other muscle spasm: Secondary | ICD-10-CM

## 2021-11-27 DIAGNOSIS — L932 Other local lupus erythematosus: Secondary | ICD-10-CM

## 2021-11-28 NOTE — Progress Notes (Signed)
Her blood count and metabolic panel look normal so no problem with the medications.  The enzyme test related to azathioprine we discussed can take a while to result and will reach out after that is available.

## 2021-12-02 ENCOUNTER — Other Ambulatory Visit: Payer: Self-pay | Admitting: Internal Medicine

## 2021-12-02 DIAGNOSIS — L932 Other local lupus erythematosus: Secondary | ICD-10-CM

## 2021-12-03 ENCOUNTER — Encounter: Payer: Self-pay | Admitting: Neurology

## 2021-12-03 NOTE — Telephone Encounter (Signed)
Next Visit: 02/19/2022  Last Visit: 11/27/2021  Labs: 11/27/2021  CMP WNL except Glucose 53 LOW CBC WNL   Eye exam: PLQ Eye Exam: 08/21/2021 WNL @ [facility]   Current Dose per office note 11/27/2021: plaquenil 200mg  po qd  DX: Lupus profundus  Last Fill: 06/23/02023  Okay to refill Plaquenil?

## 2021-12-06 LAB — COMPLETE METABOLIC PANEL WITH GFR
AG Ratio: 1.7 (calc) (ref 1.0–2.5)
ALT: 9 U/L (ref 6–29)
AST: 19 U/L (ref 10–30)
Albumin: 4.7 g/dL (ref 3.6–5.1)
Alkaline phosphatase (APISO): 67 U/L (ref 31–125)
BUN: 8 mg/dL (ref 7–25)
CO2: 27 mmol/L (ref 20–32)
Calcium: 9.3 mg/dL (ref 8.6–10.2)
Chloride: 104 mmol/L (ref 98–110)
Creat: 0.77 mg/dL (ref 0.50–0.96)
Globulin: 2.7 g/dL (calc) (ref 1.9–3.7)
Glucose, Bld: 53 mg/dL — ABNORMAL LOW (ref 65–99)
Potassium: 4.1 mmol/L (ref 3.5–5.3)
Sodium: 140 mmol/L (ref 135–146)
Total Bilirubin: 0.5 mg/dL (ref 0.2–1.2)
Total Protein: 7.4 g/dL (ref 6.1–8.1)
eGFR: 112 mL/min/{1.73_m2} (ref >=60)

## 2021-12-06 LAB — CBC WITH DIFFERENTIAL/PLATELET
Absolute Monocytes: 328 cells/uL (ref 200–950)
Basophils Absolute: 21 cells/uL (ref 0–200)
Basophils Relative: 0.4 %
Eosinophils Absolute: 21 cells/uL (ref 15–500)
Eosinophils Relative: 0.4 %
HCT: 41.7 % (ref 35.0–45.0)
Hemoglobin: 13.8 g/dL (ref 11.7–15.5)
Lymphs Abs: 2018 cells/uL (ref 850–3900)
MCH: 29.1 pg (ref 27.0–33.0)
MCHC: 33.1 g/dL (ref 32.0–36.0)
MCV: 88 fL (ref 80.0–100.0)
MPV: 10.9 fL (ref 7.5–12.5)
Monocytes Relative: 6.3 %
Neutro Abs: 2813 cells/uL (ref 1500–7800)
Neutrophils Relative %: 54.1 %
Platelets: 198 10*3/uL (ref 140–400)
RBC: 4.74 10*6/uL (ref 3.80–5.10)
RDW: 13.7 % (ref 11.0–15.0)
Total Lymphocyte: 38.8 %
WBC: 5.2 10*3/uL (ref 3.8–10.8)

## 2021-12-06 LAB — THIOPURINE METHYLTRANSFERASE (TPMT), RBC: Thiopurine Methyltransferase, RBC: 15 nmol/hr/mL RBC

## 2021-12-06 LAB — SEDIMENTATION RATE: Sed Rate: 2 mm/h (ref 0–20)

## 2021-12-10 ENCOUNTER — Telehealth: Payer: Self-pay | Admitting: Internal Medicine

## 2021-12-10 ENCOUNTER — Telehealth: Payer: Self-pay

## 2021-12-10 MED ORDER — AZATHIOPRINE 50 MG PO TABS: 50.0000 mg | ORAL_TABLET | Freq: Every day | ORAL | 2 refills | Status: DC

## 2021-12-10 NOTE — Telephone Encounter (Signed)
Advised patient that Dr. Dimple Casey was waiting on the TPMT enzyme test which did result over the weekend and hers is normal.  Advised that he sent her prescription for azathioprine 50 mg once daily to the pharmacy today. Patient expressed verbal understanding.

## 2021-12-10 NOTE — Telephone Encounter (Signed)
Patient left a voicemail stating at her last appointment Dr. Dimple Casey mentioned started a new medication but she has heard nothing since and no one has called to discuss the medication. Patient requests a call back.

## 2021-12-10 NOTE — Telephone Encounter (Signed)
Called patient to let her know that Dr. Dimple Casey will be looking at her labs to confirm that it is ok to start her on Azathioprine. We will follow up shortly.

## 2021-12-10 NOTE — Telephone Encounter (Signed)
I was waiting on the TPMT enzyme test which did result over the weekend and hers is normal.  Sending prescription for azathioprine 50 mg once daily to the pharmacy today.

## 2021-12-10 NOTE — Addendum Note (Signed)
Addended by: Fuller Plan on: 12/10/2021 03:54 PM   Modules accepted: Orders

## 2022-01-03 ENCOUNTER — Other Ambulatory Visit: Payer: Self-pay | Admitting: Internal Medicine

## 2022-01-03 DIAGNOSIS — L932 Other local lupus erythematosus: Secondary | ICD-10-CM

## 2022-01-03 NOTE — Telephone Encounter (Signed)
Next Visit: 02/19/2022  Last Visit: 11/27/2021  Last Fill: 12/10/2021  DX: Lupus profundus   Current Dose per office note 11/27/2021: dosage not discussed  Labs: 11/27/2021 Her blood count and metabolic panel look normal so no problem with the medications.  The enzyme test related to azathioprine we discussed can take a while to result and will reach out after that is available.  Okay to refill Imuran?

## 2022-01-21 NOTE — Progress Notes (Signed)
Initial neurology clinic note  Alexandria Russell MRN: [id number] DOB: [birth year]  Referring provider: Collier Salina, MD  Primary care provider: Zara Chess, NP  Reason for consult:  Headaches  Subjective:  This is Ms. Alexandria Russell, a 21 y.o. right-handed female with a medical history of migraines, lupus profundus (on HCQ, Imuran), vit D deficiency (no longer supplementing) who presents to neurology clinic with headaches. The patient is alone today.  Patient sees Dr. Benjamine Mola in rheumatology and has mentioned worsening migraine headaches. Patient is on rizatriptan without adequate relief, so she was referred to neurology for further evaluation.  Patient's current headaches are different than prior migraines. Patient has had migraines since about 2019. She describes bitemporal sharp pain, 7/10. She endorses photophobia, phonophobia, nausea and vomiting. She has been treated by Dr. Benjamine Mola in rheumatology with rizatriptan. This worked well. It has become less effective over time (really more over the last month). She was getting headaches 1-2 times per weeks. They last for 3-4 hours to 2 days. She denies aura. She occasionally takes zofran (from Mom). She has never had a headache preventative medication.  She developed a new headache type since about 09/2021. These occur more at night. They seem worse if patient is laying down. She felt like her brain would be heavy. She has these headaches once per week. They feel worse. She has a dull sensation that moves to the dependent side of where she is sleeping. They can occur when she is upright, but more when she is going to bed. She denies vision changes. She also has photophobia, phonophobia, nausea, and vomiting. This headache can wake patient out of sleep. It lasts the night and tends to get better during the day. This headache type has replaced the old headache type above since 09/2021. She currently has 1 headache per week. Her rizatriptan is not  helping much with these, but she also often throws it up. She takes this once per week. She does not take any over the counter medication. If she feels a headache, she will try drinking water, which usually helps.   She thinks pizza is a headache trigger.  She is on the Depo shot for birth control.  Of note, patient was recently evaluated by ophtho at [facility] 2023). Bilateral mucula were normal. Optic discs were also normal.  Caffeine:  Very rare soda (2 times per month) Alcohol:  once per week, 2-3 glasses mixed drink Smoker:  vapes Diet:  Normal diet Exercise:  Walks throughout work, does 3 flights of stairs multiple times per day Depression:  denies; Anxiety: daily, but mild Other pain:  some low back pain. Denies neck pain Sleep hygiene:  Until recent new headaches, patient slept well. Does not feel well rested over the last few months. Patient has changed living situation and now has to sleep on couch or blow up mattress, so she is getting used to this. Family history of headache: Mom and oldest sister has migraines  Patient mentions one episode of left face numbness about 1 month ago that lasted a couple of minutes prior to a headache. She denies previous episode of vision loss.   MEDICATIONS:  Outpatient Encounter Medications as of 01/24/2022  Medication Sig   azaTHIOprine (IMURAN) 50 MG tablet TAKE 1 TABLET BY MOUTH EVERY DAY   citalopram (CELEXA) 10 MG tablet TAKE 1 TABLET BY MOUTH EVERY DAY   cyclobenzaprine (FLEXERIL) 5 MG tablet Take 1 tablet (5 mg total) by mouth at bedtime  as needed for muscle spasms.   hydroxychloroquine (PLAQUENIL) 200 MG tablet TAKE 1 TABLET BY MOUTH EVERY DAY   rizatriptan (MAXALT) 5 MG tablet TAKE 1 TABLET BY MOUTH AS NEEDED FOR MIGRAINE. MAY REPEAT IN 2 HOURS IF NEEDED   methotrexate (RHEUMATREX) 2.5 MG tablet Take 6 tablets (15 mg total) by mouth once a week. Caution:Chemotherapy. Protect from light. (Patient not taking: Reported on  01/24/2022)   SRONYX 0.1-20 MG-MCG tablet Take 1 tablet by mouth daily. (Patient not taking: Reported on 11/27/2021)   TUBERCULIN SYR 1CC/26GX3/8" 26G X 3/8" 1 ML MISC For injection of subcutaneous methotrexate once weekly (Patient not taking: Reported on 01/24/2022)   No facility-administered encounter medications on file as of 01/24/2022.    PAST MEDICAL HISTORY: Past Medical History:  Diagnosis Date   Lupus (HCC)    Vitamin D deficiency     PAST SURGICAL HISTORY: Past Surgical History:  Procedure Laterality Date   SKIN BIOPSY Right     ALLERGIES: Allergies  Allergen Reactions   Penicillins Other (See Comments), Hives, Itching, Nausea And Vomiting, Photosensitivity, Rash and Swelling    FAMILY HISTORY: Family History  Problem Relation Age of Onset   Diverticulitis Mother     SOCIAL HISTORY: Social History   Tobacco Use   Smoking status: Never    Passive exposure: Never   Smokeless tobacco: Never  Vaping Use   Vaping Use: Every day   Substances: Nicotine  Substance Use Topics   Alcohol use: Yes    Alcohol/week: 3.0 standard drinks of alcohol    Types: 3 Glasses of wine per week    Comment: once a week   Drug use: Not Currently    Comment: once weekly   Social History   Social History Narrative   Right handed   Caffeine none   Third floor apartment    Objective:  Vital Signs:  BP 120/84   Pulse 87   Ht 5' (1.524 m)   Wt 125 lb (56.7 kg)   SpO2 100%   BMI 24.41 kg/m   General: No acute distress.  Patient appears well-groomed.   Head:  Normocephalic/atraumatic Eyes:  fundi examined, cup to disc ratio appears normal, crisp margins Neck: supple, no paraspinal tenderness, full range of motion Back: No paraspinal tenderness Heart: regular rate and rhythm Lungs: Clear to auscultation bilaterally. Vascular: No carotid bruits.  Neurological Exam: Mental status: alert and oriented, speech fluent and not dysarthric, language intact.  Cranial  nerves: CN I: not tested CN II: pupils equal, round and reactive to light, visual fields intact CN III, IV, VI:  full range of motion, no nystagmus, no ptosis CN V: facial sensation intact. CN VII: upper and lower face symmetric CN VIII: hearing intact CN IX, X: gag intact, uvula midline CN XI: sternocleidomastoid and trapezius muscles intact CN XII: tongue midline  Bulk & Tone: normal, no fasciculations. Motor:  muscle strength 5/5 throughout Deep Tendon Reflexes:  2+ throughout Sensation:  Sensation intact to light touch. Finger to nose testing:  Without dysmetria.  Gait:  Normal station and stride.  Romberg negative.   Labs and Imaging review: Internal labs: No results found for: "HGBA1C" No results found for: "VITAMINB12" No results found for: "TSH" Lab Results  Component Value Date   ESRSEDRATE 2 11/27/2021   Normal or unremarkable: CMP, CBC, vit D   Assessment/Plan:  Alexandria Russell is a 21 y.o. female who presents for evaluation of new and worsening headache. She has a relevant medical history  of migraines, lupus profundus (on HCQ, Imuran), vit D deficiency. Her neurological examination is essentially normal, including her fundus. While patient has a history of migraine, her headaches since 09/2021 are different in quality, intensity, and character. They have become more positional and are not as responsive to her triptan. I am concerned about another etiology other than migraine. The differential includes increased intracranial pressure (IIH vs other intracranial pathology), venous sinus thrombosis, or medication such as HCQ could be inducing headache. Patient's examination, including her fundi is currently normal and her headaches are currently occurring once per week, so I will investigate further before attempting to treat.  PLAN: -Blood work: TSH, B12 -MRI brain w/wo contrast -MRV head w/wo contrast -Lumbar puncture if MRI looks okay (IIH) -Discussed reasons to go to  ED including visual changes or focal neurologic deficits or thunderclap headache   -Return to clinic in 2 months  The impression above as well as the plan as outlined below were extensively discussed with the patient who voiced understanding. All questions were answered to their satisfaction.  When available, results of the above investigations and possible further recommendations will be communicated to the patient via telephone/MyChart. Patient to call office if not contacted after expected testing turnaround time.   Total time spent reviewing records, interview, history/exam, documentation, and coordination of care on day of encounter:  55 min   Thank you for allowing me to participate in patient's care.  If I can answer any additional questions, I would be pleased to do so.  Jacquelyne Balint, MD   CC: Venia Minks, NP [address] [place] [address] [place] [address]  CC: Referring provider: Fuller Plan, MD [address] [place],  [place] [address]

## 2022-01-24 ENCOUNTER — Encounter: Payer: Self-pay | Admitting: Neurology

## 2022-01-24 ENCOUNTER — Ambulatory Visit: Payer: Medicaid Other | Admitting: Neurology

## 2022-01-24 ENCOUNTER — Other Ambulatory Visit (INDEPENDENT_AMBULATORY_CARE_PROVIDER_SITE_OTHER): Payer: Medicaid Other

## 2022-01-24 VITALS — BP 120/84 | HR 87 | Ht 60.0 in | Wt 125.0 lb

## 2022-01-24 DIAGNOSIS — L932 Other local lupus erythematosus: Secondary | ICD-10-CM | POA: Diagnosis not present

## 2022-01-24 DIAGNOSIS — Z79899 Other long term (current) drug therapy: Secondary | ICD-10-CM

## 2022-01-24 DIAGNOSIS — G43909 Migraine, unspecified, not intractable, without status migrainosus: Secondary | ICD-10-CM | POA: Diagnosis not present

## 2022-01-24 DIAGNOSIS — R51 Headache with orthostatic component, not elsewhere classified: Secondary | ICD-10-CM

## 2022-01-24 LAB — TSH: TSH: 0.51 u[IU]/mL (ref 0.35–5.50)

## 2022-01-24 LAB — VITAMIN B12: Vitamin B-12: 135 pg/mL — ABNORMAL LOW (ref 211–911)

## 2022-01-24 NOTE — Patient Instructions (Signed)
I saw you today for change and worsening in your headaches.  I would like to investigate further to make sure I understand the cause of your headaches.  I would like to get some blood work today.  I would like to get an MRI of your brain and also look at the blood vessels that drain your head (MRV).  If the MRI looks okay, I will likely recommend you get a lumbar puncture to make sure you do not have elevated pressure causing the new headache.  I will be in touch when I have your test results.  Let me know if your headaches are worsening or changing. If you have a sudden onset worst headache of your life, have vision changes or loss, or other weakness or numbness on one side of your body, I want you to go to the nearest emergency department to be evaluated.  I want to see you again in 2 months in clinic.  Please let me know if you have any questions or concerns in the meantime.  The physicians and staff at [facility] are committed to providing excellent care. You may receive a survey requesting feedback about your experience at our office. We strive to receive "very good" responses to the survey questions. If you feel that your experience would prevent you from giving the office a "very good " response, please contact our office to try to remedy the situation. We may be reached at [phone]. Thank you for taking the time out of your busy day to complete the survey.  Kai Levins, MD [facility]

## 2022-02-01 DIAGNOSIS — N912 Amenorrhea, unspecified: Secondary | ICD-10-CM | POA: Diagnosis not present

## 2022-02-06 ENCOUNTER — Ambulatory Visit: Payer: Medicaid Other | Admitting: Neurology

## 2022-02-06 NOTE — Progress Notes (Signed)
Office Visit Note  Patient: Alexandria Russell             Date of Birth: October 29, 2000           MRN: [id number]             PCP: Venia Minks, NP Referring: Venia Minks, NP Visit Date: 02/19/2022   Subjective:  Follow-up (Chronic headaches and recent results from neurologist. Doing well on current medications.)   History of Present Illness: Alexandria Russell is a 21 y.o. 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  She has been doing okay on these medications without any major intolerance problem.  Still having some superficial tender areas on the skin.  She saw dermatology and was prescribed triamcinolone ointment.  She saw neurology for the new headache problems and had head imaging with no intracranial problem of note did have some scalp changes consistent with cutaneous lesions.  Previous HPI 11/27/2021 Alexandria Russell is a 21 y.o. 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  Since her last visit she is having some increased symptoms in multiple areas.  Has increased joint pain affecting hands and wrists most commonly after she exerts herself more heavily such as having to move and lift a lot of objects for work.  She is having ongoing trouble with migraine headaches these are associated with light sensitivity and gets pain lateral to the eye sometimes affects both sides.  She was treated for asymptomatic chlamydia infection detected on STI screening in June finished antibiotics for this completely.  We had to switch her to oral methotrexate due to unavailability of the subcutaneous dosing.  She is reporting lower abdominal pain and GI intolerance worse for up to about 3 days of the week when taking her methotrexate.   Previous HPI 08/29/2021 Alexandria Russell is a 21 y.o. female here for follow up for lupus profundus inflammatory changes currently on HCQ 200 mg daily and  methotrexate 15 mg [place] weekly and folic acid 1 mg daily. She has some ongoing pain in the backs of her legs in previously affected areas. She notices some pain between the thumb and index finger of both hands without visible changes. Her back pain and muscle spasm improved with as needed use of flexeril. She is scheduled for EGD/colonoscopy tomorrow for dysphagia symptoms.   Previous HPI 07/31/2021 Alexandria Russell is a 21 y.o. 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 She has been doing fairly well but with increased pain and stiffness in her upper arms and upper legs in past 2 weeks. She notices mildly tender right sided cervical lymph node swelling painful to lie or turn to that side. She had some upper respiratory symptoms she thinks were seasonal allergies with some drainage. Fatigue is doing well. Still having frequent abdominal pain and feels like the overlying skin change and swelling is no better.   Previous HPI 05/02/21 Alexandria Russell is a 21 y.o. 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 So far not a dramatic change in skin inflammatory changes but face and leg improving, with still areas of raised nodules and recessed skin areas especially on her trunk. No specific intolerance of the HCQ.   Previous HPI 02/21/21 Alexandria Russell is a 21 y.o. female here for lupus  profundus. She was originally diagnosed about 2 years ago with initial steroid treatment of about 6 months. Primary symptoms at that time include joint pains in multiple sites and skin rashes on the face and nodular skin rashes on her torso and limbs. Diagnosis including skin biopsy taken from the leg and arm and breast. She was treated with imuran and hydroxychloroquine apparently stopped imuran due to nausea and switch to mycophenolate. She stopped taking all medications due to  feeling quite sick on the combination and did not follow up regularly for about 2 years. She experienced several flares of symptom worsening with joint pain and pain over areas of skin swelling and redness usually treated with short term steroid treatments. She moved from [place] area to [place] around the start of this year and established with local physician treated with oral prednisone 5mg  daily at this time partially controlling symptoms. She last had an episode of increased symptoms about 3 weeks ago. She is also taking high dose weekly vitamin d supplementation for low vitamin D. Besides the panniculitis symptoms she also reports patchy alopecia on the scalp and on temporal areas associated with posterior cervical adenopathy. She does notice symptoms increased with high amounts of sun exposure. She has lost about 10-20 pounds since this all started but stable during the past year. She denies any raynaud's symptoms or any history of blood clots.   Labs reviewed 12/2020 Vit D 15.0   05/2018 ANA 1:160 TPMT 18.9   Review of Systems  Constitutional:  Positive for fatigue.  HENT:  Negative for mouth sores and mouth dryness.   Eyes:  Negative for dryness.  Respiratory:  Negative for shortness of breath.   Cardiovascular:  Negative for chest pain and palpitations.  Gastrointestinal:  Negative for blood in stool, constipation and diarrhea.  Endocrine: Negative for increased urination.  Genitourinary:  Negative for involuntary urination.  Musculoskeletal:  Positive for joint pain, joint pain, myalgias, muscle weakness, muscle tenderness and myalgias. Negative for gait problem, joint swelling and morning stiffness.  Skin:  Positive for hair loss and sensitivity to sunlight. Negative for color change and rash.  Allergic/Immunologic: Negative for susceptible to infections.  Neurological:  Positive for headaches. Negative for dizziness.  Hematological:  Positive for swollen glands.   Psychiatric/Behavioral:  Positive for sleep disturbance. Negative for depressed mood. The patient is nervous/anxious.     PMFS History:  Patient Active Problem List   Diagnosis Date Noted   Muscle spasm 08/29/2021   Migraine headache 05/02/2021   Lupus profundus 02/21/2021   Vitamin D deficiency 02/21/2021   High risk medication use 02/21/2021    Past Medical History:  Diagnosis Date   Lupus (HCC)    Vitamin D deficiency     Family History  Problem Relation Age of Onset   Diverticulitis Mother    Past Surgical History:  Procedure Laterality Date   SKIN BIOPSY Right    Social History   Social History Narrative   Right handed   Caffeine none   Third floor apartment    There is no immunization history on file for this patient.   Objective: Vital Signs: BP 107/75 (BP Location: Right Arm, Patient Position: Sitting, Cuff Size: Normal)   Pulse 81   Resp 15   Ht 5' (1.524 m)   Wt 125 lb 9.6 oz (57 kg)   LMP 11/24/2021   BMI 24.53 kg/m    Physical Exam Neck:     Comments: Mildly enlarged posterior cervical lymph nodes on  both sides with mild tenderness Cardiovascular:     Rate and Rhythm: Normal rate and regular rhythm.  Pulmonary:     Effort: Pulmonary effort is normal.     Breath sounds: Normal breath sounds.  Lymphadenopathy:     Cervical: Cervical adenopathy present.  Skin:    General: Skin is warm and dry.     Comments: Patchy areas with atrophy or absence of subcutaneous fat layer affecting lateral upper arms, abdomen around umbilicus, and at medial thigh with minimal tenderness no warmth or discoloration  Neurological:     Mental Status: She is alert.  Psychiatric:        Mood and Affect: Mood normal.        Musculoskeletal Exam:  Shoulders full ROM no tenderness or swelling Elbows full ROM no tenderness or swelling Wrists full ROM no tenderness or swelling Fingers full ROM no tenderness or swelling Knees full ROM no tenderness or swelling Ankles  full ROM no tenderness or swelling  Investigation: No additional findings.  Imaging: MR MRV HEAD W WO CONTRAST  Result Date: 02/17/2022 CLINICAL DATA:  Provided history: Migraine without status migrainosus, not intractable, unspecified migraine-type. Positional headache. Lupus profundus. High risk medication use. EXAM: MRI HEAD WITHOUT AND WITH CONTRAST MR VENOGRAM HEAD WITHOUT AND WITH CONTRAST TECHNIQUE: Multiplanar, multi-echo pulse sequences of the brain and surrounding structures were acquired without and with intravenous contrast. Angiographic images of the intracranial venous structures were acquired using MRV technique without and with intravenous contrast. CONTRAST:  6 mL Vueway intravenous contrast. COMPARISON:  None. FINDINGS: MRI HEAD WITHOUT AND WITH CONTRAST Brain: Cerebral volume is normal. 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 No cortical encephalomalacia is identified. No significant cerebral white matter disease. There is no acute infarct. No evidence of an intracranial mass. No chronic intracranial blood products. No extra-axial fluid collection. No midline shift. No pathologic intracranial enhancement identified. Vascular: Maintained flow voids within the proximal large arterial vessels. Skull and upper cervical spine: No focal suspicious marrow lesion. Sinuses/Orbits: No mass or acute finding within the imaged orbits. Minimal mucosal thickening within the left frontal sinus. Other: Multiple sizable scalp lesions are noted. MR VENOGRAM HEAD WITHOUT AND WITH CONTRAST The superior sagittal sinus, internal cerebral veins, vein of Galen, straight sinus, transverse sinuses, sigmoid sinuses and visualized jugular veins are patent. There is no appreciable intracranial venous thrombosis. No dural venous sinus stenosis. IMPRESSION: MRI brain: 1. No evidence of acute intracranial abnormality. 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 3. Otherwise unremarkable MRI appearance of the brain. 4. Multiple sizable scalp lesions are noted.  Correlate with the patient's medical/procedural history. MRV head: 1. No evidence of intracranial venous thrombosis. 2. No evidence of dural venous sinus stenosis. Electronically Signed   By: Kellie Simmering D.O.   On: 02/17/2022 19:56   MR BRAIN W WO CONTRAST  Result Date: 02/17/2022 CLINICAL DATA:  Provided history: Migraine without status migrainosus, not intractable, unspecified migraine-type. Positional headache. Lupus profundus. High risk medication use. EXAM: MRI HEAD WITHOUT AND WITH CONTRAST MR VENOGRAM HEAD WITHOUT AND WITH CONTRAST TECHNIQUE: Multiplanar, multi-echo pulse sequences of the brain and surrounding structures were acquired without and with intravenous contrast. Angiographic images of the intracranial venous structures were acquired using MRV technique without and with intravenous contrast. CONTRAST:  6 mL Vueway intravenous contrast. COMPARISON:  None. FINDINGS: MRI HEAD WITHOUT AND WITH CONTRAST Brain: Cerebral volume is normal. 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 No cortical encephalomalacia is identified. No significant cerebral white matter disease. There is no acute infarct. No  evidence of an intracranial mass. No chronic intracranial blood products. No extra-axial fluid collection. No midline shift. No pathologic intracranial enhancement identified. Vascular: Maintained flow voids within the proximal large arterial vessels. Skull and upper cervical spine: No focal suspicious marrow lesion. Sinuses/Orbits: No mass or acute finding within the imaged orbits. Minimal mucosal thickening within the left frontal sinus. Other: Multiple sizable scalp lesions are noted. MR VENOGRAM HEAD WITHOUT AND WITH CONTRAST The superior sagittal sinus, internal cerebral veins, vein of Galen, straight sinus, transverse sinuses, sigmoid sinuses and visualized jugular veins are patent. There is no appreciable intracranial venous thrombosis. No dural venous sinus stenosis. IMPRESSION: MRI brain: 1. No evidence of acute  intracranial abnormality. 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 3. Otherwise unremarkable MRI appearance of the brain. 4. Multiple sizable scalp lesions are noted. Correlate with the patient's medical/procedural history. MRV head: 1. No evidence of intracranial venous thrombosis. 2. No evidence of dural venous sinus stenosis. Electronically Signed   By: Jackey Loge D.O.   On: 02/17/2022 19:56    Recent Labs: Lab Results  Component Value Date   WBC 5.2 11/27/2021   HGB 13.8 11/27/2021   PLT 198 11/27/2021   NA 140 11/27/2021   K 4.1 11/27/2021   CL 104 11/27/2021   CO2 27 11/27/2021   GLUCOSE 53 (L) 11/27/2021   BUN 8 11/27/2021   CREATININE 0.77 11/27/2021   BILITOT 0.5 11/27/2021   ALKPHOS 79 12/29/2020   AST 19 11/27/2021   ALT 9 11/27/2021   PROT 7.4 11/27/2021   ALBUMIN 4.2 12/29/2020   CALCIUM 9.3 11/27/2021    Speciality Comments: PLQ Eye Exam: 12/06/2021 WNL @ [facility] Follow up in 1 year  Procedures:  No procedures performed Allergies: Penicillins   Assessment / Plan:     Visit Diagnoses: Lupus profundus - Plan: hydroxychloroquine (PLAQUENIL) 200 MG tablet  No new deep soft tissue lesions or changes noted since last visit is having some active inflammation on the scalp likely related to her lupus.  Not causing excessive symptoms or scarring alopecia continue current treatments at this time with hydroxychloroquine 200 mg daily Imuran 50 mg daily and the topical triamcinolone ointment up to twice daily as needed.  High risk medication use - plaquenil 200mg  po qd and methotrexate 0.1mL once weekly - Plan: CBC with Differential/Platelet, COMPLETE METABOLIC PANEL WITH GFR  Checking CBC and CMP for medication monitoring on azathioprine and hydroxychloroquine.  No major reported intolerance most recent hydroxychloroquine retinal toxicity screening exam in August was fine.  Chronic migraine without aura without status migrainosus, not intractable  Seeing neurology now for management  due to symptoms not well-controlled with just the as needed Maxalt 5 mg.  Head neuroimaging was reassuring.  Orders: Orders Placed This Encounter  Procedures   CBC with Differential/Platelet   COMPLETE METABOLIC PANEL WITH GFR   Meds ordered this encounter  Medications   hydroxychloroquine (PLAQUENIL) 200 MG tablet    Sig: Take 1 tablet (200 mg total) by mouth daily.    Dispense:  90 tablet    Refill:  1     Follow-Up Instructions: Return in about 3 months (around 05/22/2022) for Lupus profundus on HCQ/AZA f/u 35mos.   0mo, MD  Note - This record has been created using [employer].  Chart creation errors have been sought, but may not always  have been located. Such creation errors do not reflect on  the standard of medical care.

## 2022-02-15 ENCOUNTER — Ambulatory Visit
Admission: RE | Admit: 2022-02-15 | Discharge: 2022-02-15 | Disposition: A | Discharge status: Home / Self Care | Payer: Medicaid Other | Source: Ambulatory Visit | Attending: Neurology | Admitting: Neurology

## 2022-02-15 DIAGNOSIS — G43909 Migraine, unspecified, not intractable, without status migrainosus: Secondary | ICD-10-CM

## 2022-02-15 DIAGNOSIS — L932 Other local lupus erythematosus: Secondary | ICD-10-CM

## 2022-02-15 DIAGNOSIS — R519 Headache, unspecified: Secondary | ICD-10-CM | POA: Diagnosis not present

## 2022-02-15 DIAGNOSIS — L7212 Trichodermal cyst: Secondary | ICD-10-CM | POA: Diagnosis not present

## 2022-02-15 DIAGNOSIS — R51 Headache with orthostatic component, not elsewhere classified: Secondary | ICD-10-CM

## 2022-02-15 DIAGNOSIS — Z79899 Other long term (current) drug therapy: Secondary | ICD-10-CM

## 2022-02-15 DIAGNOSIS — G43809 Other migraine, not intractable, without status migrainosus: Secondary | ICD-10-CM | POA: Diagnosis not present

## 2022-02-15 MED ORDER — GADOPICLENOL 0.5 MMOL/ML IV SOLN
6.0000 mL | Freq: Once | INTRAVENOUS | Status: AC | PRN
  Administered 2022-02-15: 6 mL via INTRAVENOUS

## 2022-02-18 ENCOUNTER — Encounter: Payer: Self-pay | Admitting: Neurology

## 2022-02-18 ENCOUNTER — Telehealth: Payer: Self-pay | Admitting: Neurology

## 2022-02-18 NOTE — Telephone Encounter (Signed)
Attempted to call to discuss MRI/MRV of brain. Will send MyChart message to see if patient is agreeable to lumbar puncture to evaluate for IIH.  Kai Levins, MD [facility]rology

## 2022-02-19 ENCOUNTER — Telehealth: Payer: Self-pay

## 2022-02-19 ENCOUNTER — Ambulatory Visit: Payer: Medicaid Other | Attending: Internal Medicine | Admitting: Internal Medicine

## 2022-02-19 ENCOUNTER — Encounter: Payer: Self-pay | Admitting: Internal Medicine

## 2022-02-19 VITALS — BP 107/75 | HR 81 | Resp 15 | Ht 60.0 in | Wt 125.6 lb

## 2022-02-19 DIAGNOSIS — Z79899 Other long term (current) drug therapy: Secondary | ICD-10-CM

## 2022-02-19 DIAGNOSIS — G43709 Chronic migraine without aura, not intractable, without status migrainosus: Secondary | ICD-10-CM

## 2022-02-19 DIAGNOSIS — L932 Other local lupus erythematosus: Secondary | ICD-10-CM | POA: Diagnosis not present

## 2022-02-19 LAB — CBC WITH DIFFERENTIAL/PLATELET
Absolute Monocytes: 335 cells/uL (ref 200–950)
Basophils Absolute: 22 cells/uL (ref 0–200)
Basophils Relative: 0.4 %
Eosinophils Absolute: 22 cells/uL (ref 15–500)
Eosinophils Relative: 0.4 %
HCT: 41.6 % (ref 35.0–45.0)
Hemoglobin: 13.6 g/dL (ref 11.7–15.5)
Lymphs Abs: 2511 cells/uL (ref 850–3900)
MCH: 28.5 pg (ref 27.0–33.0)
MCHC: 32.7 g/dL (ref 32.0–36.0)
MCV: 87.2 fL (ref 80.0–100.0)
MPV: 10.6 fL (ref 7.5–12.5)
Monocytes Relative: 6.2 %
Neutro Abs: 2511 cells/uL (ref 1500–7800)
Neutrophils Relative %: 46.5 %
Platelets: 178 10*3/uL (ref 140–400)
RBC: 4.77 10*6/uL (ref 3.80–5.10)
RDW: 12.5 % (ref 11.0–15.0)
Total Lymphocyte: 46.5 %
WBC: 5.4 10*3/uL (ref 3.8–10.8)

## 2022-02-19 LAB — COMPLETE METABOLIC PANEL WITH GFR
AG Ratio: 1.7 (calc) (ref 1.0–2.5)
ALT: 7 U/L (ref 6–29)
AST: 17 U/L (ref 10–30)
Albumin: 4.6 g/dL (ref 3.6–5.1)
Alkaline phosphatase (APISO): 67 U/L (ref 31–125)
BUN/Creatinine Ratio: 10 (calc) (ref 6–22)
BUN: 6 mg/dL — ABNORMAL LOW (ref 7–25)
CO2: 28 mmol/L (ref 20–32)
Calcium: 9.1 mg/dL (ref 8.6–10.2)
Chloride: 103 mmol/L (ref 98–110)
Creat: 0.63 mg/dL (ref 0.50–0.96)
Globulin: 2.7 g/dL (calc) (ref 1.9–3.7)
Glucose, Bld: 74 mg/dL (ref 65–99)
Potassium: 3.6 mmol/L (ref 3.5–5.3)
Sodium: 139 mmol/L (ref 135–146)
Total Bilirubin: 0.4 mg/dL (ref 0.2–1.2)
Total Protein: 7.3 g/dL (ref 6.1–8.1)
eGFR: 129 mL/min/{1.73_m2} (ref >=60)

## 2022-02-19 MED ORDER — TRIAMCINOLONE ACETONIDE 0.5 % EX OINT: 1.0000 | TOPICAL_OINTMENT | Freq: Two times a day (BID) | CUTANEOUS | 2 refills | Status: DC | PRN

## 2022-02-19 MED ORDER — HYDROXYCHLOROQUINE SULFATE 200 MG PO TABS: 200.0000 mg | ORAL_TABLET | Freq: Every day | ORAL | 1 refills | Status: DC

## 2022-02-19 NOTE — Telephone Encounter (Signed)
Called and left message the return call to office.

## 2022-02-19 NOTE — Telephone Encounter (Signed)
Pt called back in and left a message with the access nurse. She was returning our call about her results

## 2022-02-20 ENCOUNTER — Telehealth: Payer: Self-pay

## 2022-02-20 NOTE — Telephone Encounter (Signed)
Spoke with patient about MRI/MRV (normal) and next possible steps. We discussed that next steps could be to do a lumbar puncture looking for high pressure, knowing there could be side effects from an LP, or monitoring symptoms. Given she is having one HA per week with no vision changes, she elected to monitor her symptoms.  She will call with new or worsening symptoms. She will follow up next month.  All questions were answered.  Jacquelyne Balint, MD [facility] Neurology

## 2022-02-20 NOTE — Progress Notes (Signed)
Lab results look okay no problems for continuing the hydroxychloroquine and Imuran with no new changes at this time.

## 2022-03-22 NOTE — Progress Notes (Deleted)
NEUROLOGY FOLLOW UP OFFICE NOTE  Alexandria Russell [id number]  Subjective:  Alexandria Russell is a 21 y.o. year old 21 y.o. right-handed female with a medical history of migraines, lupus profundus (on HCQ, Imuran), vit D deficiency (no longer supplementing) who we last saw on 01/24/22.  To briefly review: Patient sees Dr. Dimple Casey in rheumatology and has mentioned worsening migraine headaches. Patient is on rizatriptan without adequate relief, so she was referred to neurology for further evaluation.   Patient's current headaches are different than prior migraines. Patient has had migraines since about 2019. She describes bitemporal sharp pain, 7/10. She endorses photophobia, phonophobia, nausea and vomiting. She has been treated by Dr. Dimple Casey in rheumatology with rizatriptan. This worked well. It has become less effective over time (really more over the last month). She was getting headaches 1-2 times per weeks. They last for 3-4 hours to 2 days. She denies aura. She occasionally takes zofran (from Mom). She has never had a headache preventative medication.   She developed a new headache type since about 09/2021. These occur more at night. They seem worse if patient is laying down. She felt like her brain would be heavy. She has these headaches once per week. They feel worse. She has a dull sensation that moves to the dependent side of where she is sleeping. They can occur when she is upright, but more when she is going to bed. She denies vision changes. She also has photophobia, phonophobia, nausea, and vomiting. This headache can wake patient out of sleep. It lasts the night and tends to get better during the day. This headache type has replaced the old headache type above since 09/2021. She currently has 1 headache per week. Her rizatriptan is not helping much with these, but she also often throws it up. She takes this once per week. She does not take any over the counter medication. If she feels a headache,  she will try drinking water, which usually helps.    She thinks pizza is a headache trigger.   She is on the Depo shot for birth control.   Of note, patient was recently evaluated by ophtho at [facility] 2023). Bilateral mucula were normal. Optic discs were also normal.   Caffeine:  Very rare soda (2 times per month) Alcohol:  once per week, 2-3 glasses mixed drink Smoker:  vapes Diet:  Normal diet Exercise:  Walks throughout work, does 3 flights of stairs multiple times per day Depression:  denies; Anxiety: daily, but mild Other pain:  some low back pain. Denies neck pain Sleep hygiene:  Until recent new headaches, patient slept well. Does not feel well rested over the last few months. Patient has changed living situation and now has to sleep on couch or blow up mattress, so she is getting used to this. Family history of headache: Mom and oldest sister has migraines   Patient mentions one episode of left face numbness about 1 month ago that lasted a couple of minutes prior to a headache. She denies previous episode of vision loss.  Most recent Assessment and Plan (01/24/22): While patient has a history of migraine, her headaches since 09/2021 are different in quality, intensity, and character. They have become more positional and are not as responsive to her triptan. I am concerned about another etiology other than migraine. The differential includes increased intracranial pressure (IIH vs other intracranial pathology), venous sinus thrombosis, or medication such as HCQ could be inducing headache. Patient's examination, including her  fundi is currently normal and her headaches are currently occurring once per week, so I will investigate further before attempting to treat.   PLAN: -Blood work: TSH, B12 -MRI brain w/wo contrast -MRV head w/wo contrast -Lumbar puncture if MRI looks okay (IIH) -Discussed reasons to go to ED including visual changes or focal neurologic deficits or  thunderclap headache  Since their last visit: ***  TSH was normal. B12 was low at 135. I recommended B12 1000 mcg daily***.  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 MRV was normal. After discussion with patient, given that she was not having frequent headaches, the decision was made to monitor symptoms and defer LP for now.  Maxalt?***   MEDICATIONS:  Outpatient Encounter Medications as of 03/28/2022  Medication Sig   triamcinolone ointment (KENALOG) 0.5 % Apply 1 Application topically 2 (two) times daily as needed.   azaTHIOprine (IMURAN) 50 MG tablet TAKE 1 TABLET BY MOUTH EVERY DAY   citalopram (CELEXA) 10 MG tablet TAKE 1 TABLET BY MOUTH EVERY DAY (Patient not taking: Reported on 02/19/2022)   cyclobenzaprine (FLEXERIL) 5 MG tablet Take 1 tablet (5 mg total) by mouth at bedtime as needed for muscle spasms. (Patient not taking: Reported on 02/19/2022)   hydroxychloroquine (PLAQUENIL) 200 MG tablet Take 1 tablet (200 mg total) by mouth daily.   rizatriptan (MAXALT) 5 MG tablet TAKE 1 TABLET BY MOUTH AS NEEDED FOR MIGRAINE. MAY REPEAT IN 2 HOURS IF NEEDED   SRONYX 0.1-20 MG-MCG tablet Take 1 tablet by mouth daily. (Patient not taking: Reported on 11/27/2021)   No facility-administered encounter medications on file as of 03/28/2022.    PAST MEDICAL HISTORY: Past Medical History:  Diagnosis Date   Lupus (HCC)    Vitamin D deficiency     PAST SURGICAL HISTORY: Past Surgical History:  Procedure Laterality Date   SKIN BIOPSY Right     ALLERGIES: Allergies  Allergen Reactions   Penicillins Other (See Comments), Hives, Itching, Nausea And Vomiting, Photosensitivity, Rash and Swelling    FAMILY HISTORY: Family History  Problem Relation Age of Onset   Diverticulitis Mother     SOCIAL HISTORY: Social History   Tobacco Use   Smoking status: Never    Passive exposure: Never   Smokeless tobacco: Never  Vaping Use   Vaping Use: Every day    Substances: Nicotine  Substance Use Topics   Alcohol use: Yes    Alcohol/week: 1.0 standard drink of alcohol    Types: 1 Glasses of wine per week    Comment: once a week   Drug use: Not Currently    Comment: once weekly   Social History   Social History Narrative   Right handed   Caffeine none   Third floor apartment      Objective:  Vital Signs:  There were no vitals taken for this visit.  ***  Labs and Imaging review: New results: ***  Previously reviewed results: ***  Assessment/Plan:  This is Horris Latino, a 21 y.o. female with:  ***   Plan: ***  Return to clinic in ***  Total time spent reviewing records, interview, history/exam, documentation, and coordination of care on day of encounter:  *** min  Jacquelyne Balint, MD  CC: ***

## 2022-03-28 ENCOUNTER — Ambulatory Visit: Payer: Medicaid Other | Admitting: Neurology
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
## 2022-05-02 ENCOUNTER — Other Ambulatory Visit: Payer: Self-pay | Admitting: Internal Medicine

## 2022-05-02 DIAGNOSIS — L932 Other local lupus erythematosus: Secondary | ICD-10-CM

## 2022-05-02 NOTE — Telephone Encounter (Signed)
Please schedule patient a follow up visit. Patient due February 2024 . Thanks!   Follow-Up Instructions: Return in about 3 months (around 05/22/2022) for Lupus profundus on HCQ/AZA f/u 43mos.

## 2022-05-02 NOTE — Telephone Encounter (Signed)
Next Visit: Due February . Message sent to the front to schedule.   Last Visit: 02/19/2022  Last Fill: 01/03/2022  DX:  Lupus profundus    Current Dose per office note 02/19/2022: Imuran 50 mg daily   Labs: 02/19/2022 Lab results look okay no problems for continuing the hydroxychloroquine and Imuran with no new changes at this time.   Okay to refill Imuran?

## 2022-05-03 ENCOUNTER — Telehealth: Payer: Self-pay | Admitting: Internal Medicine

## 2022-05-03 ENCOUNTER — Encounter: Payer: Self-pay | Admitting: Neurology

## 2022-05-03 ENCOUNTER — Ambulatory Visit (INDEPENDENT_AMBULATORY_CARE_PROVIDER_SITE_OTHER): Payer: Medicaid Other | Admitting: Neurology

## 2022-05-03 VITALS — BP 115/80 | HR 96 | Ht 60.0 in | Wt 128.0 lb

## 2022-05-03 DIAGNOSIS — G43909 Migraine, unspecified, not intractable, without status migrainosus: Secondary | ICD-10-CM

## 2022-05-03 DIAGNOSIS — M542 Cervicalgia: Secondary | ICD-10-CM | POA: Diagnosis not present

## 2022-05-03 DIAGNOSIS — R29898 Other symptoms and signs involving the musculoskeletal system: Secondary | ICD-10-CM

## 2022-05-03 DIAGNOSIS — R400 Somnolence: Secondary | ICD-10-CM

## 2022-05-03 DIAGNOSIS — R519 Headache, unspecified: Secondary | ICD-10-CM | POA: Diagnosis not present

## 2022-05-03 DIAGNOSIS — R5383 Other fatigue: Secondary | ICD-10-CM

## 2022-05-03 MED ORDER — SUMATRIPTAN SUCCINATE 100 MG PO TABS: 100.0000 mg | ORAL_TABLET | Freq: Once | ORAL | 5 refills | Status: DC | PRN

## 2022-05-03 NOTE — Patient Instructions (Addendum)
Your B12 was low. Given your symptoms, I would recommend supplementing with B12 1000 mcg daily. This can be bought over the counter at any local drug store or online.   I am prescribing Sumatriptan 100 mg as needed for headaches. I have sent this to your pharmacy. Take at the onset of headaches before it gets bad.  Vitamins and herbs that show potential in headaches  Magnesium: Magnesium (250 mg twice a day or 500 mg at bed) has a relaxant effect on smooth muscles such as blood vessels. Individuals suffering from frequent or daily headache usually have low magnesium levels which can be increase with daily supplementation of 400-750 mg. Three trials found 40-90% average headache reduction when used as a preventative. Magnesium also demonstrated the benefit in menstrually related migraine. Magnesium is part of the messenger system in the serotonin cascade and it is a good muscle relaxant. It is also useful for constipation which can be a side effect of other medications used to treat migraine. Good sources include nuts, whole grains, and tomatoes.  Riboflavin (vitamin B 2) 200 mg twice a day. This vitamin assists nerve cells in the production of ATP a principal energy storing molecule. It is necessary for many chemical reactions in the body. There have been at least 3 clinical trials of riboflavin using 400 mg per day all of which suggested that migraine frequency can be decreased. All 3 trials showed significant improvement in over half of migraine sufferers. The supplement is found in bread, cereal, milk, meat, and poultry. Most Americans get more riboflavin than the recommended daily allowance, however riboflavin deficiency is not necessary for the supplements to help prevent headache.  Feverfew: Feverfew is a common garden herb native to [place] and popular in [place] as a treatment for disorders typically controlled by aspirin. The mechanism of action is unknown but is believed to be related to a  chemical called parthenolide which helps the body use serotonin more effectively. Serotonin helps prevent migraine and assists with resolution when it occurs. Parthenolide also inhibits the release of histamine which is linked to pain and inflammation.  Consistency of active ingredients in different products can be a problem. Some formulations don't have the active ingredient (parthenolide) that prevents migraine. A parthenolide content of 0.2% is generally recommended. Typical dosage is one capsule 3 times a day.  Coenzyme Q10: This is present in almost all cells in the body and is critical component for the conversion of energy. Recent studies have shown that a nutritional supplement of CoQ10 can reduce the frequency of migraine attacks by improving the energy production of cells as with riboflavin. Doses of 150 mg twice a day have been shown to be effective.  Melatonin: Increasing evidence shows correlation between melatonin secretion and headache conditions. Melatonin supplementation has decreased headache intensity and duration. It is widely used as a sleep aid. Sleep is natures way of dealing with migraine. A dose of 3 mg is recommended to start for headaches including cluster headache. Higher doses up to 15 mg has been reviewed for use in Cluster headache and have been used. The rationale behind using melatonin for cluster is that many theories regarding the cause of Cluster headache center around the disruption of the normal circadian rhythm in the brain. This helps restore the normal circadian rhythm.  Ginger: Ginger has a small amount of antihistamine and anti-inflammatory action which may help headache. It is primarily used for nausea and may aid in the absorption of other medications.  I am sending you to sleep medicine for possible sleep study due to poor sleep quality and headaches at night.  I want to see you back in 6 months or sooner if needed.  The physicians and staff at [facility] are committed to providing excellent care. You may receive a survey requesting feedback about your experience at our office. We strive to receive "very good" responses to the survey questions. If you feel that your experience would prevent you from giving the office a "very good " response, please contact our office to try to remedy the situation. We may be reached at [phone]. Thank you for taking the time out of your busy day to complete the survey.  Kai Levins, MD [facility]

## 2022-05-03 NOTE — Addendum Note (Signed)
Addended by: Renae Gloss on: 05/03/2022 03:19 PM   Modules accepted: Orders

## 2022-05-03 NOTE — Telephone Encounter (Signed)
See Rx notes for details.

## 2022-05-03 NOTE — Telephone Encounter (Signed)
Patient requested prescription refill of Azathioprine to be sent to [facility] in [employer] on [address].

## 2022-05-17 ENCOUNTER — Other Ambulatory Visit: Payer: Self-pay | Admitting: Internal Medicine

## 2022-05-17 DIAGNOSIS — L932 Other local lupus erythematosus: Secondary | ICD-10-CM

## 2022-05-27 DIAGNOSIS — Z3042 Encounter for surveillance of injectable contraceptive: Secondary | ICD-10-CM | POA: Diagnosis not present

## 2022-05-30 ENCOUNTER — Encounter: Payer: Self-pay | Admitting: Adult Health

## 2022-05-30 ENCOUNTER — Ambulatory Visit: Payer: Medicaid Other | Admitting: Adult Health

## 2022-05-30 VITALS — BP 118/74 | HR 98 | Temp 98.3°F | Ht 60.0 in | Wt 126.6 lb

## 2022-05-30 DIAGNOSIS — R400 Somnolence: Secondary | ICD-10-CM | POA: Diagnosis not present
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
## 2022-05-30 NOTE — Patient Instructions (Signed)
Set up for home sleep study  Healthy sleep regimen  Do not drive if sleepy  Work on not vaping  Follow up in 6-8 weeks to discuss results and treatment plan.

## 2022-05-30 NOTE — Assessment & Plan Note (Signed)
Daytime sleepiness, chronic headaches, teeth grinding/clenching are concerning for possible underlying sleep apnea.  Patient also has significant daytime sleepiness despite sleeping for more than 8 to 10 hours each night.  Concern for possible underlying narcolepsy.  For now we will check home sleep study.  If negative consider MSLT.-Multi sleep latency test to rule out narcolepsy.  Healthy sleep regimen discussed.  Patient education on sleep apnea - discussed how weight can impact sleep and risk for sleep disordered breathing - discussed options to assist with weight loss: combination of diet modification, cardiovascular and strength training exercises   - had an extensive discussion regarding the adverse health consequences related to untreated sleep disordered breathing - specifically discussed the risks for hypertension, coronary artery disease, cardiac dysrhythmias, cerebrovascular disease, and diabetes - lifestyle modification discussed   - discussed how sleep disruption can increase risk of accidents, particularly when driving - safe driving practices were discussed   Plan  Patient Instructions  Set up for home sleep study  Healthy sleep regimen  Do not drive if sleepy  Work on not vaping  Follow up in 6-8 weeks to discuss results and treatment plan.

## 2022-05-31 NOTE — Progress Notes (Signed)
Reviewed and agree with assessment/plan.   Chesley Mires, MD [facility] Pulmonary/Critical Care 05/31/2022, 10:37 AM Pager:  [phone]

## 2022-06-03 NOTE — Progress Notes (Signed)
Office Visit Note  Patient: Alexandria Russell             Date of Birth: 06-17-2000           MRN: [id number]             PCP: Zara Chess, NP Referring: Zara Chess, NP Visit Date: 06/04/2022   Subjective:  Follow-up (Patient states her hands start shaking and will last 5-10 minutes. Patient states she started to notice it last week.)   History of Present Illness: Alexandria Russell is a 22 y.o. 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  She has not noticed any new areas of painful subcutaneous nodules or swelling.  Still gets some tender areas on the skin without significant discoloration.  Ongoing mild scalp irritation or scaling and has some cervical lymphadenopathy but not painful.  She has had some episodes of shaking or tremor in her hands lasting several minutes at a time but has not noticed any specific trigger for these.  Previous HPI 02/19/22 Alexandria Russell is a 22 y.o. 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  She has been doing okay on these medications without any major intolerance problem.  Still having some superficial tender areas on the skin.  She saw dermatology and was prescribed triamcinolone ointment.  She saw neurology for the new headache problems and had head imaging with no intracranial problem of note did have some scalp changes consistent with cutaneous lesions.   Previous HPI 11/27/2021 Alexandria Russell is a 22 y.o. 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  Since her last visit she is having some increased symptoms in multiple areas.  Has increased joint pain affecting hands and wrists most commonly after she exerts herself more heavily such as having to move and lift a lot of objects for work.  She is having ongoing trouble with migraine headaches these are associated with light  sensitivity and gets pain lateral to the eye sometimes affects both sides.  She was treated for asymptomatic chlamydia infection detected on STI screening in June finished antibiotics for this completely.  We had to switch her to oral methotrexate due to unavailability of the subcutaneous dosing.  She is reporting lower abdominal pain and GI intolerance worse for up to about 3 days of the week when taking her methotrexate.   Previous HPI 08/29/2021 Alexandria Russell is a 22 y.o. female here for follow up for lupus profundus inflammatory changes currently on HCQ 200 mg daily and methotrexate 15 mg [place] weekly and folic acid 1 mg daily. She has some ongoing pain in the backs of her legs in previously affected areas. She notices some pain between the thumb and index finger of both hands without visible changes. Her back pain and muscle spasm improved with as needed use of flexeril. She is scheduled for EGD/colonoscopy tomorrow for dysphagia symptoms.   Previous HPI 07/31/2021 Alexandria Russell is a 22 y.o. 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 She has been doing fairly well but with increased pain and stiffness in her upper arms and upper legs in past 2 weeks. She notices mildly tender right sided cervical lymph node swelling painful to lie or turn to that side. She had some upper respiratory symptoms she thinks were seasonal  allergies with some drainage. Fatigue is doing well. Still having frequent abdominal pain and feels like the overlying skin change and swelling is no better.   Previous HPI 05/02/21 Alexandria Russell is a 22 y.o. 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 So far not a dramatic change in skin inflammatory changes but face and leg improving, with still areas of raised nodules and recessed skin areas especially on her trunk. No specific  intolerance of the HCQ.   Previous HPI 02/21/21 Alexandria Russell is a 22 y.o. 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 She was originally diagnosed about 2 years ago with initial steroid treatment of about 6 months. Primary symptoms at that time include joint pains in multiple sites and skin rashes on the face and nodular skin rashes on her torso and limbs. Diagnosis including skin biopsy taken from the leg and arm and breast. She was treated with imuran and hydroxychloroquine apparently stopped imuran due to nausea and switch to mycophenolate. She stopped taking all medications due to feeling quite sick on the combination and did not follow up regularly for about 2 years. She experienced several flares of symptom worsening with joint pain and pain over areas of skin swelling and redness usually treated with short term steroid treatments. She moved from [place] area to [place] around the start of this year and established with local physician treated with oral prednisone 5mg  daily at this time partially controlling symptoms. She last had an episode of increased symptoms about 3 weeks ago. She is also taking high dose weekly vitamin d supplementation for low vitamin D. Besides the panniculitis symptoms she also reports patchy alopecia on the scalp and on temporal areas associated with posterior cervical adenopathy. She does notice symptoms increased with high amounts of sun exposure. She has lost about 10-20 pounds since this all started but stable during the past year. She denies any raynaud's symptoms or any history of blood clots.   Labs reviewed 12/2020 Vit D 15.0   05/2018 ANA 1:160 TPMT 18.9   Review of Systems  Constitutional:  Negative for fatigue.  HENT:  Negative for mouth sores and mouth dryness.   Eyes:  Negative for dryness.  Respiratory:  Negative for shortness of breath.   Cardiovascular:  Negative for chest pain and palpitations.  Gastrointestinal:  Negative for blood in stool,  constipation and diarrhea.  Endocrine: Negative for increased urination.  Genitourinary:  Negative for involuntary urination.  Musculoskeletal:  Positive for joint pain, joint pain, joint swelling, myalgias, muscle weakness, muscle tenderness and myalgias. Negative for gait problem and morning stiffness.  Skin:  Positive for sensitivity to sunlight. Negative for color change, rash and hair loss.  Allergic/Immunologic: Positive for susceptible to infections.  Neurological:  Positive for dizziness and headaches.  Hematological:  Positive for swollen glands.  Psychiatric/Behavioral:  Positive for sleep disturbance. Negative for depressed mood. The patient is not nervous/anxious.     PMFS History:  Patient Active Problem List   Diagnosis Date Noted   Daytime sleepiness 05/30/2022   Muscle spasm 08/29/2021   Migraine headache 05/02/2021   Lupus profundus 02/21/2021   Vitamin D deficiency 02/21/2021   High risk medication use 02/21/2021    Past Medical History:  Diagnosis Date   Lupus ([place])    Vitamin D deficiency     Family History  Problem Relation Age of Onset   Diverticulitis Mother    Past Surgical History:  Procedure  Laterality Date   SKIN BIOPSY Right    Social History   Social History Narrative   Right handed   Caffeine none   apartment   Live with boyfriend   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   Objective: Vital Signs: BP 101/67 (BP Location: Left Arm, Patient Position: Sitting, Cuff Size: Normal)   Pulse 76   Resp 12   Ht 5' (1.524 m)   Wt 127 lb (57.6 kg)   LMP  (LMP Unknown)   BMI 24.80 kg/m    Physical Exam Eyes:     Conjunctiva/sclera: Conjunctivae normal.  Neck:     Comments: Posterior cervical lymph nodes enlarged, minimal tenderness Cardiovascular:     Rate and Rhythm: Normal rate and regular rhythm.  Pulmonary:     Effort: Pulmonary effort is normal.     Breath sounds: Normal  breath sounds.  Lymphadenopathy:     Cervical: Cervical adenopathy present.  Skin:    General: Skin is warm and dry.     Findings: Rash present.     Comments: Areas with atrophy of subcutaneous fat layer affecting lateral upper arms, abdomen around umbilicus, and at medial thigh that are minimally tender with no warmth or discoloration   Neurological:     Mental Status: She is alert.  Psychiatric:        Mood and Affect: Mood normal.      Musculoskeletal Exam:  Shoulders full ROM no tenderness or swelling Elbows full ROM no tenderness or swelling Wrists full ROM no tenderness or swelling Fingers full ROM no tenderness or swelling Knees full ROM no tenderness or swelling Ankles full ROM no tenderness or swelling   Investigation: No additional findings.  Imaging: No results found.  Recent Labs: Lab Results  Component Value Date   WBC 5.4 02/19/2022   HGB 13.6 02/19/2022   PLT 178 02/19/2022   NA 139 02/19/2022   K 3.6 02/19/2022   CL 103 02/19/2022   CO2 28 02/19/2022   GLUCOSE 74 02/19/2022   BUN 6 (L) 02/19/2022   CREATININE 0.63 02/19/2022   BILITOT 0.4 02/19/2022   ALKPHOS 79 12/29/2020   AST 17 02/19/2022   ALT 7 02/19/2022   PROT 7.3 02/19/2022   ALBUMIN 4.2 12/29/2020   CALCIUM 9.1 02/19/2022    Speciality Comments: PLQ Eye Exam: 12/06/2021 WNL @ [facility] Follow up in 1 year  Procedures:  No procedures performed Allergies: Penicillins   Assessment / Plan:     Visit Diagnoses: Lupus profundus - Plan: Ambulatory referral to Plastic Surgery  Low disease activity there still appears to be some degree of scalp inflammation and likely associated with the posterior cervical adenopathy.  No peripheral joint synovitis.  Will plan to continue the hydroxychloroquine 200 mg and azathioprine 50 mg daily.  She has questions about any options to improve the cosmetic appearance of previous lesions with loss of subcutaneous fat layer.  Will refer to plastic  surgery for evaluation and discussing management options.  High risk medication use  Most recent labs reviewed are fine on low-dose treatment recommend we can recheck blood count metabolic panel at next clinic follow-up.  Do recommend follow-up at 51-month interval.  Most recent ophthalmology exam in August fine with 1 year follow-up recommended.  Muscle spasm  Not sure about the muscle twitching or spasming but symptoms are intermittent and not particularly debilitating.  I do not  think it is associated with any inflammatory process.  Recommend just monitoring for now if getting worse we need to follow-up with her neurologist.  Orders: Orders Placed This Encounter  Procedures   Ambulatory referral to Plastic Surgery   No orders of the defined types were placed in this encounter.    Follow-Up Instructions: Return in about 3 months (around 09/02/2022) for Lupus profundus AZA/HCA f/u 56mos.   Collier Salina, MD  Note - This record has been created using [employer].  Chart creation errors have been sought, but may not always  have been located. Such creation errors do not reflect on  the standard of medical care.

## 2022-06-04 ENCOUNTER — Encounter: Payer: Self-pay | Admitting: Internal Medicine

## 2022-06-04 ENCOUNTER — Ambulatory Visit: Payer: Medicaid Other | Attending: Internal Medicine | Admitting: Internal Medicine

## 2022-06-04 VITALS — BP 101/67 | HR 76 | Resp 12 | Ht 60.0 in | Wt 127.0 lb

## 2022-06-04 DIAGNOSIS — L932 Other local lupus erythematosus: Secondary | ICD-10-CM

## 2022-06-04 DIAGNOSIS — M62838 Other muscle spasm: Secondary | ICD-10-CM

## 2022-06-04 DIAGNOSIS — Z79899 Other long term (current) drug therapy: Secondary | ICD-10-CM

## 2022-07-09 ENCOUNTER — Ambulatory Visit (INDEPENDENT_AMBULATORY_CARE_PROVIDER_SITE_OTHER): Payer: Medicaid Other | Admitting: Plastic Surgery

## 2022-07-09 VITALS — BP 110/74 | HR 96 | Ht 60.0 in | Wt 134.0 lb

## 2022-07-09 DIAGNOSIS — M959 Acquired deformity of musculoskeletal system, unspecified: Secondary | ICD-10-CM

## 2022-07-09 DIAGNOSIS — L932 Other local lupus erythematosus: Secondary | ICD-10-CM

## 2022-07-09 DIAGNOSIS — Z7969 Long term (current) use of other immunomodulators and immunosuppressants: Secondary | ICD-10-CM

## 2022-07-09 NOTE — Progress Notes (Signed)
Patient ID: Alexandria Russell, female    DOB: [birth year], 22 y.o.   MRN: [id number]   Chief Complaint  Patient presents with   Consult    The patient is a very sweet 22 year old female here for evaluation of her arms and her abdomen.  She is 5 feet tall and weighs 134 pounds.  She works at [employer].  She was diagnosed with lupus a few years ago and is on Plaquenil.  She noticed some fat loss in her arms and stomach that have given her some irregularities and she wanted to see if she could do anything about it.  She is being very diligent on increasing her protein and taking her vitamins to stay healthy.  She complained about a divot on her abdomen.  Upon exam actually looks like she has a hernia and it is quite tender.  The patient then stated that she actually had some sort of a ultrasound or film in the past which showed a hernia but small and perhaps it has gotten larger.     Review of Systems  Constitutional: Negative.   HENT: Negative.    Eyes: Negative.   Respiratory: Negative.  Negative for shortness of breath.   Cardiovascular: Negative.   Gastrointestinal:  Positive for abdominal pain.  Genitourinary: Negative.   Musculoskeletal: Negative.     Past Medical History:  Diagnosis Date   Lupus ([place])    Vitamin D deficiency     Past Surgical History:  Procedure Laterality Date   SKIN BIOPSY Right       Current Outpatient Medications:    azaTHIOprine (IMURAN) 50 MG tablet, TAKE 1 TABLET BY MOUTH EVERY DAY, Disp: 30 tablet, Rfl: 2   hydroxychloroquine (PLAQUENIL) 200 MG tablet, Take 1 tablet (200 mg total) by mouth daily., Disp: 90 tablet, Rfl: 1   SUMAtriptan (IMITREX) 100 MG tablet, Take 1 tablet (100 mg total) by mouth once as needed for up to 1 dose for migraine. May repeat in 2 hours if headache persists or recurs., Disp: 10 tablet, Rfl: 5   Objective:   Vitals:   07/09/22 0905  BP: 110/74  Pulse: 96  SpO2: 99%    Physical Exam Nursing note reviewed.   Constitutional:      Appearance: Normal appearance.  HENT:     Head: Normocephalic and atraumatic.  Cardiovascular:     Rate and Rhythm: Normal rate.     Pulses: Normal pulses.  Pulmonary:     Effort: Pulmonary effort is normal. No respiratory distress.  Abdominal:     Palpations: Abdomen is soft.     Tenderness: There is abdominal tenderness.  Musculoskeletal:        General: Deformity present. No swelling.  Skin:    General: Skin is warm.     Capillary Refill: Capillary refill takes less than 2 seconds.     Coloration: Skin is not jaundiced.     Findings: No bruising.  Neurological:     Mental Status: She is alert and oriented to person, place, and time.  Psychiatric:        Mood and Affect: Mood normal.        Behavior: Behavior normal.        Thought Content: Thought content normal.        Judgment: Judgment normal.     Assessment & Plan:  Lupus profundus Possible umbilical hernia  Recommend consult to general surgery for the possible hernia.  I do not think she should  do anything about her arms otherwise.  Because of the Plaquenil she has a chance of having very small slow prolonged healing.  The patient agrees to stay safe.  No surgery recommended from our standpoint.  [place], DO

## 2022-08-19 DIAGNOSIS — Z3042 Encounter for surveillance of injectable contraceptive: Secondary | ICD-10-CM | POA: Diagnosis not present

## 2022-09-13 ENCOUNTER — Ambulatory Visit: Payer: Medicaid Other | Admitting: Internal Medicine

## 2022-10-04 NOTE — Progress Notes (Unsigned)
Office Visit Note  Patient: Alexandria Russell             Date of Birth: 12-Dec-2000           MRN: [id number]             PCP: Venia Minks, NP Referring: Venia Minks, NP Visit Date: 10/07/2022   Subjective:  No chief complaint on file.   History of Present Illness: Alexandria Russell is a 22 y.o. 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    Previous HPI 06/04/2022 Alexandria Russell is a 22 y.o. 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  She has not noticed any new areas of painful subcutaneous nodules or swelling.  Still gets some tender areas on the skin without significant discoloration.  Ongoing mild scalp irritation or scaling and has some cervical lymphadenopathy but not painful.  She has had some episodes of shaking or tremor in her hands lasting several minutes at a time but has not noticed any specific trigger for these.   Previous HPI 02/19/22 Alexandria Russell is a 22 y.o. 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  She has been doing okay on these medications without any major intolerance problem.  Still having some superficial tender areas on the skin.  She saw dermatology and was prescribed triamcinolone ointment.  She saw neurology for the new headache problems and had head imaging with no intracranial problem of note did have some scalp changes consistent with cutaneous lesions.   Previous HPI 11/27/2021 Alexandria Russell is a 22 y.o. 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  Since her last visit she is having some increased symptoms in multiple areas.  Has increased joint pain affecting hands and wrists most commonly after she exerts herself more heavily such as having to move and lift a lot of objects for work.  She is having  ongoing trouble with migraine headaches these are associated with light sensitivity and gets pain lateral to the eye sometimes affects both sides.  She was treated for asymptomatic chlamydia infection detected on STI screening in June finished antibiotics for this completely.  We had to switch her to oral methotrexate due to unavailability of the subcutaneous dosing.  She is reporting lower abdominal pain and GI intolerance worse for up to about 3 days of the week when taking her methotrexate.   Previous HPI 08/29/2021 Alexandria Russell is a 22 y.o. 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 She has some ongoing pain in the backs of her legs in previously affected areas. She notices some pain between the thumb and index finger of both hands without visible changes. Her back pain and muscle spasm improved with as needed use of flexeril. She is scheduled for EGD/colonoscopy tomorrow for dysphagia symptoms.   Previous HPI 07/31/2021 Alexandria Russell is a 22 y.o. 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 She has been doing fairly well but with increased pain and stiffness in her upper arms and upper legs in past 2 weeks. She notices mildly tender right sided cervical lymph node swelling painful to lie  or turn to that side. She had some upper respiratory symptoms she thinks were seasonal allergies with some drainage. Fatigue is doing well. Still having frequent abdominal pain and feels like the overlying skin change and swelling is no better.   Previous HPI 05/02/21 Alexandria Russell is a 22 y.o. 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 So far not a dramatic change in skin inflammatory changes but face and leg improving, with still areas of raised  nodules and recessed skin areas especially on her trunk. No specific intolerance of the HCQ.   Previous HPI 02/21/21 Alexandria Russell is a 22 y.o. 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 She was originally diagnosed about 2 years ago with initial steroid treatment of about 6 months. Primary symptoms at that time include joint pains in multiple sites and skin rashes on the face and nodular skin rashes on her torso and limbs. Diagnosis including skin biopsy taken from the leg and arm and breast. She was treated with imuran and hydroxychloroquine apparently stopped imuran due to nausea and switch to mycophenolate. She stopped taking all medications due to feeling quite sick on the combination and did not follow up regularly for about 2 years. She experienced several flares of symptom worsening with joint pain and pain over areas of skin swelling and redness usually treated with short term steroid treatments. She moved from [place] area to [place] around the start of this year and established with local physician treated with oral prednisone 5mg  daily at this time partially controlling symptoms. She last had an episode of increased symptoms about 3 weeks ago. She is also taking high dose weekly vitamin d supplementation for low vitamin D. Besides the panniculitis symptoms she also reports patchy alopecia on the scalp and on temporal areas associated with posterior cervical adenopathy. She does notice symptoms increased with high amounts of sun exposure. She has lost about 10-20 pounds since this all started but stable during the past year. She denies any raynaud's symptoms or any history of blood clots.   Labs reviewed 12/2020 Vit D 15.0   05/2018 ANA 1:160 TPMT 18.9   No Rheumatology ROS completed.   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   Objective: Vital Signs: There were no vitals taken for this visit.   Physical Exam   Musculoskeletal Exam: ***  CDAI Exam: CDAI Score: -- Patient Global: --; Provider Global: -- Swollen: --; Tender: -- Joint Exam 10/07/2022   No joint exam has been documented for this visit   There is currently no information documented on the homunculus. Go to the Rheumatology activity and complete the homunculus joint exam.  Investigation: No additional findings.  Imaging: No results found.  Recent Labs: Lab Results  Component Value Date   WBC 5.4 02/19/2022   HGB 13.6 02/19/2022   PLT 178 02/19/2022   NA 139 02/19/2022   K 3.6 02/19/2022   CL 103 02/19/2022   CO2 28 02/19/2022   GLUCOSE 74 02/19/2022   BUN 6 (L) 02/19/2022   CREATININE 0.63 02/19/2022   BILITOT 0.4 02/19/2022   ALKPHOS 79 12/29/2020   AST 17 02/19/2022   ALT 7 02/19/2022   PROT 7.3 02/19/2022   ALBUMIN 4.2 12/29/2020   CALCIUM 9.1 02/19/2022    Speciality Comments: PLQ Eye Exam: 12/06/2021 WNL @ [facility] Follow up in 1 year  Procedures:  No procedures performed Allergies: Penicillins   Assessment / Plan:     Visit Diagnoses: No diagnosis found.  ***  Orders: No orders of the defined types were placed in this encounter.  No orders of the defined types were placed in this encounter.    Follow-Up Instructions: No follow-ups on file.   Ellen Henri, CMA  Note - This record has  been created using Animal nutritionist.  Chart creation errors have been sought, but may not always  have been located. Such creation errors do not reflect on  the standard of medical care.

## 2022-10-07 ENCOUNTER — Encounter: Payer: Self-pay | Admitting: Internal Medicine

## 2022-10-07 ENCOUNTER — Ambulatory Visit: Payer: Medicaid Other | Attending: Internal Medicine | Admitting: Internal Medicine

## 2022-10-07 VITALS — BP 102/67 | HR 88 | Resp 12 | Ht 60.0 in | Wt 134.0 lb

## 2022-10-07 DIAGNOSIS — L932 Other local lupus erythematosus: Secondary | ICD-10-CM | POA: Diagnosis not present

## 2022-10-07 DIAGNOSIS — E559 Vitamin D deficiency, unspecified: Secondary | ICD-10-CM

## 2022-10-07 DIAGNOSIS — M62838 Other muscle spasm: Secondary | ICD-10-CM

## 2022-10-07 DIAGNOSIS — Z79899 Other long term (current) drug therapy: Secondary | ICD-10-CM

## 2022-10-08 LAB — COMPLETE METABOLIC PANEL WITH GFR
AG Ratio: 1.7 (calc) (ref 1.0–2.5)
ALT: 9 U/L (ref 6–29)
AST: 16 U/L (ref 10–30)
Albumin: 4.6 g/dL (ref 3.6–5.1)
Alkaline phosphatase (APISO): 99 U/L (ref 31–125)
BUN: 7 mg/dL (ref 7–25)
CO2: 27 mmol/L (ref 20–32)
Calcium: 9.6 mg/dL (ref 8.6–10.2)
Chloride: 104 mmol/L (ref 98–110)
Creat: 0.65 mg/dL (ref 0.50–0.96)
Globulin: 2.7 g/dL (calc) (ref 1.9–3.7)
Glucose, Bld: 64 mg/dL — ABNORMAL LOW (ref 65–99)
Potassium: 4 mmol/L (ref 3.5–5.3)
Sodium: 140 mmol/L (ref 135–146)
Total Bilirubin: 0.3 mg/dL (ref 0.2–1.2)
Total Protein: 7.3 g/dL (ref 6.1–8.1)
eGFR: 128 mL/min/{1.73_m2} (ref >=60)

## 2022-10-08 LAB — CBC WITH DIFFERENTIAL/PLATELET
Absolute Monocytes: 414 cells/uL (ref 200–950)
Basophils Absolute: 22 cells/uL (ref 0–200)
Basophils Relative: 0.4 %
Eosinophils Absolute: 28 cells/uL (ref 15–500)
Eosinophils Relative: 0.5 %
HCT: 41.2 % (ref 35.0–45.0)
Hemoglobin: 13.4 g/dL (ref 11.7–15.5)
Lymphs Abs: 2324 cells/uL (ref 850–3900)
MCH: 27.5 pg (ref 27.0–33.0)
MCHC: 32.5 g/dL (ref 32.0–36.0)
MCV: 84.6 fL (ref 80.0–100.0)
MPV: 10.6 fL (ref 7.5–12.5)
Monocytes Relative: 7.4 %
Neutro Abs: 2811 cells/uL (ref 1500–7800)
Neutrophils Relative %: 50.2 %
Platelets: 207 10*3/uL (ref 140–400)
RBC: 4.87 10*6/uL (ref 3.80–5.10)
RDW: 12.9 % (ref 11.0–15.0)
Total Lymphocyte: 41.5 %
WBC: 5.6 10*3/uL (ref 3.8–10.8)

## 2022-10-08 LAB — SEDIMENTATION RATE: Sed Rate: 6 mm/h (ref 0–20)

## 2022-10-08 LAB — VITAMIN D 25 HYDROXY (VIT D DEFICIENCY, FRACTURES): Vit D, 25-Hydroxy: 21 ng/mL — ABNORMAL LOW (ref 30–100)

## 2022-10-08 NOTE — Progress Notes (Signed)
Blood count and metabolic panel are fine no problems with current medication. Her vitamin D is mildly deficient at 21. I recommend adding daily vitamin D supplement with about 1000 units, over the counter vitamin D3 is fine.
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
## 2022-11-05 DIAGNOSIS — R059 Cough, unspecified: Secondary | ICD-10-CM | POA: Diagnosis not present

## 2022-11-05 DIAGNOSIS — U071 COVID-19: Secondary | ICD-10-CM | POA: Diagnosis not present

## 2022-11-06 ENCOUNTER — Ambulatory Visit: Payer: Medicaid Other | Admitting: Neurology

## 2022-11-18 DIAGNOSIS — Z113 Encounter for screening for infections with a predominantly sexual mode of transmission: Secondary | ICD-10-CM | POA: Diagnosis not present

## 2022-11-18 DIAGNOSIS — Z30013 Encounter for initial prescription of injectable contraceptive: Secondary | ICD-10-CM | POA: Diagnosis not present
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
## 2023-02-21 ENCOUNTER — Telehealth: Payer: Self-pay

## 2023-02-21 ENCOUNTER — Ambulatory Visit: Payer: Medicaid Other | Admitting: Neurology

## 2023-02-21 ENCOUNTER — Encounter: Payer: Self-pay | Admitting: Neurology

## 2023-02-21 VITALS — BP 117/80 | HR 92 | Ht 60.0 in | Wt 141.0 lb

## 2023-02-21 DIAGNOSIS — E559 Vitamin D deficiency, unspecified: Secondary | ICD-10-CM | POA: Diagnosis not present

## 2023-02-21 DIAGNOSIS — E538 Deficiency of other specified B group vitamins: Secondary | ICD-10-CM | POA: Diagnosis not present

## 2023-02-21 DIAGNOSIS — H53149 Visual discomfort, unspecified: Secondary | ICD-10-CM | POA: Diagnosis not present

## 2023-02-21 DIAGNOSIS — G43009 Migraine without aura, not intractable, without status migrainosus: Secondary | ICD-10-CM | POA: Diagnosis not present

## 2023-02-21 DIAGNOSIS — M542 Cervicalgia: Secondary | ICD-10-CM | POA: Diagnosis not present

## 2023-02-21 DIAGNOSIS — R112 Nausea with vomiting, unspecified: Secondary | ICD-10-CM | POA: Diagnosis not present

## 2023-02-21 DIAGNOSIS — R29898 Other symptoms and signs involving the musculoskeletal system: Secondary | ICD-10-CM

## 2023-02-21 DIAGNOSIS — F40298 Other specified phobia: Secondary | ICD-10-CM

## 2023-02-21 DIAGNOSIS — E348 Other specified endocrine disorders: Secondary | ICD-10-CM

## 2023-02-21 MED ORDER — ONDANSETRON 4 MG PO TBDP: 4.0000 mg | ORAL_TABLET | Freq: Three times a day (TID) | ORAL | 0 refills | Status: AC | PRN

## 2023-02-21 MED ORDER — NORTRIPTYLINE HCL 10 MG PO CAPS: 20.0000 mg | ORAL_CAPSULE | Freq: Every day | ORAL | 5 refills | Status: AC

## 2023-02-21 MED ORDER — UBRELVY 100 MG PO TABS: 100.0000 mg | ORAL_TABLET | ORAL | 5 refills | Status: AC | PRN

## 2023-02-21 NOTE — Patient Instructions (Addendum)
We will repeat your MRI brain to monitor your pineal cyst.  For migraines: Migraine prevention:  nortriptyline 10 mg at bedtime for 1 week, then increase to 20 mg at bedtime thereafter Migraine rescue:  Start Ubrelvy 100 mg as needed at onset of headache. I will order this today, but it may take some time to get insurance to approve it. Zofran under the tongue as needed for nausea Limit use of pain relievers to no more than 2 days out of week to prevent risk of rebound or medication-overuse headache. Keep headache diary   Return to clinic in 3 months  Please let me know if you have any questions or concerns in the meantime.  The physicians and staff at [facility] are committed to providing excellent care. You may receive a survey requesting feedback about your experience at our office. We strive to receive "very good" responses to the survey questions. If you feel that your experience would prevent you from giving the office a "very good " response, please contact our office to try to remedy the situation. We may be reached at [phone]. Thank you for taking the time out of your busy day to complete the survey.  Jacquelyne Balint, MD [facility]

## 2023-03-03 DIAGNOSIS — Z30013 Encounter for initial prescription of injectable contraceptive: Secondary | ICD-10-CM | POA: Diagnosis not present

## 2023-03-10 ENCOUNTER — Other Ambulatory Visit (HOSPITAL_COMMUNITY): Payer: Self-pay

## 2023-03-10 ENCOUNTER — Telehealth: Payer: Self-pay | Admitting: Pharmacy Technician

## 2023-03-10 IMAGING — CT CT ABD-PELV W/ CM
2 of 4 series · 17 of 46 positions shown, 19 images · IV contrast (OMNIPAQUE 350)
Comparison: None.

CLINICAL DATA: Abdominal pain, acute, nonlocalized

EXAM:
CT ABDOMEN AND PELVIS WITH CONTRAST
TECHNIQUE: Multidetector CT imaging of the abdomen and pelvis was performed
using the standard protocol following bolus administration of
intravenous contrast.
CONTRAST:  60mL OMNIPAQUE IOHEXOL 350 MG/ML SOLN

[Series 2: axial st · axial · 0.67mm/px · z∈[+1223,+1573]mm · 14 of 80 slices shown, 16 images]
[im 5/80  soft-tissue]
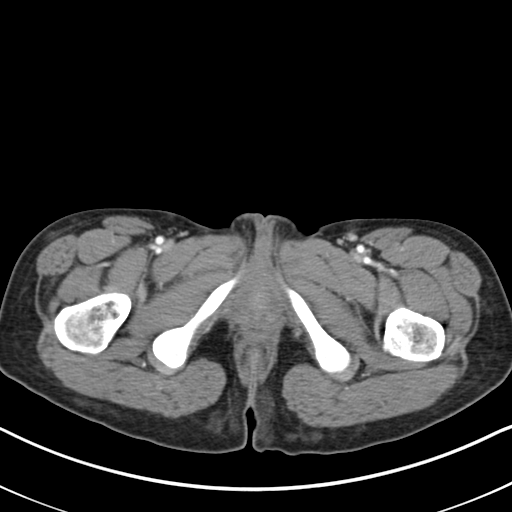
[im 5/80  bone]
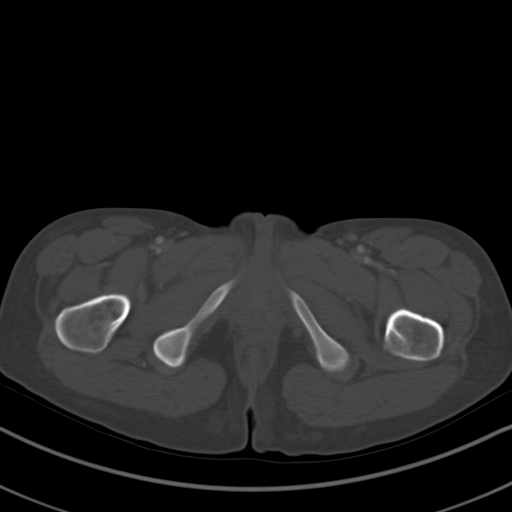
[im 9/80  soft-tissue]
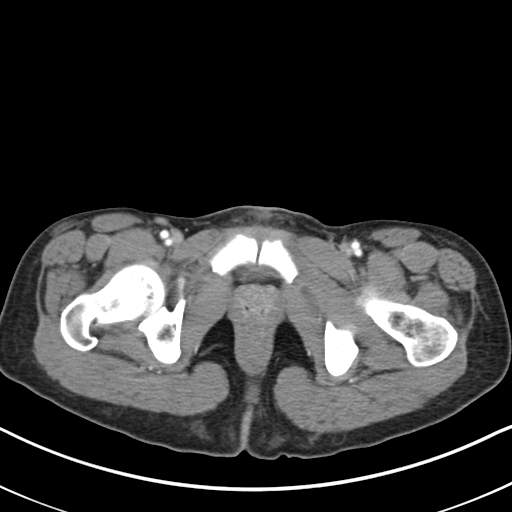
[im 17/80  soft-tissue]
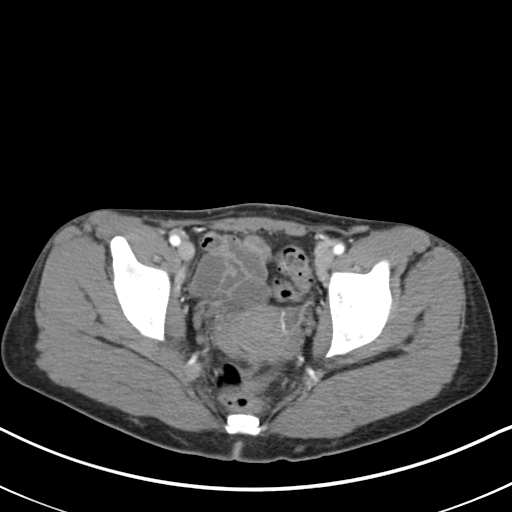
[im 21/80  soft-tissue]
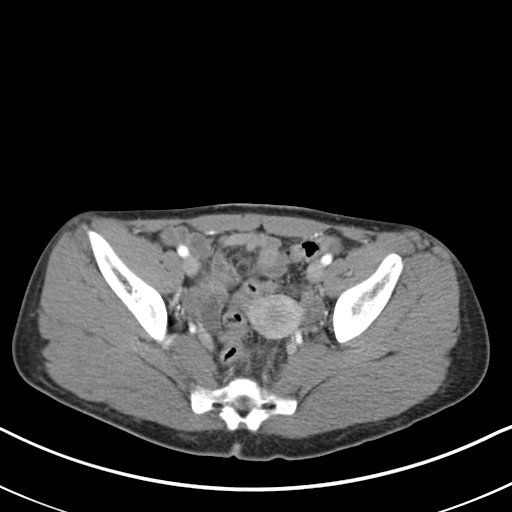
[im 25/80  soft-tissue]
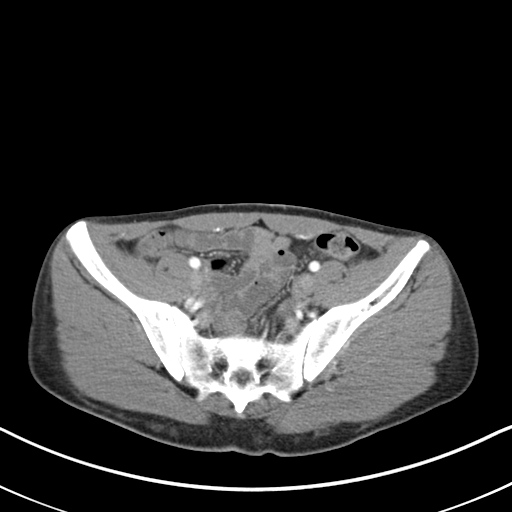
[im 34/80  soft-tissue]
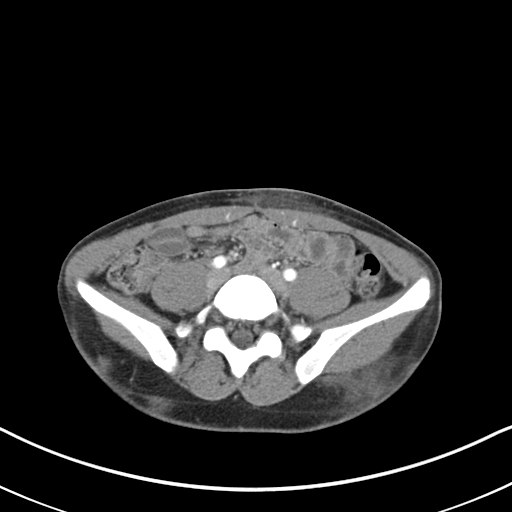
[im 38/80  soft-tissue]
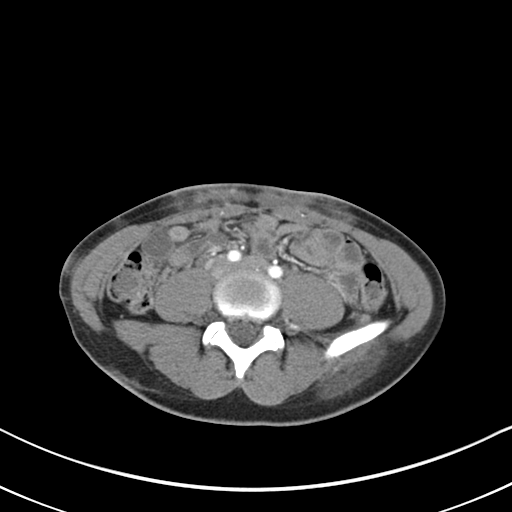
[im 42/80  soft-tissue]
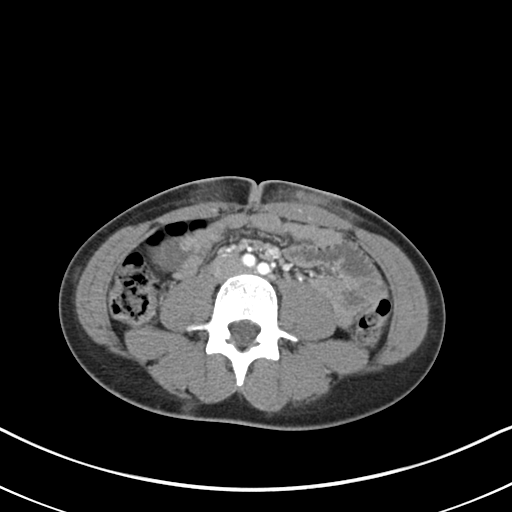
[im 46/80  soft-tissue]
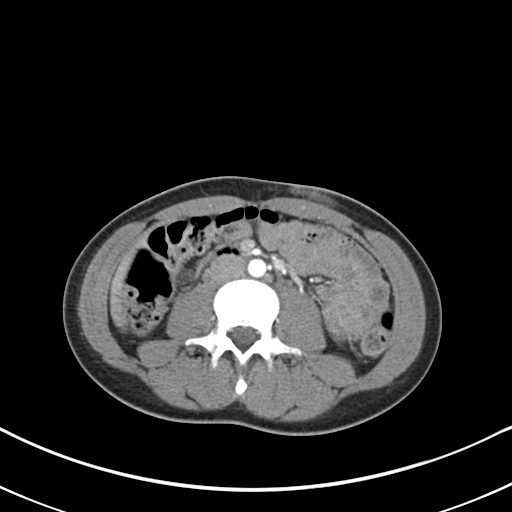
[im 46/80  bone]
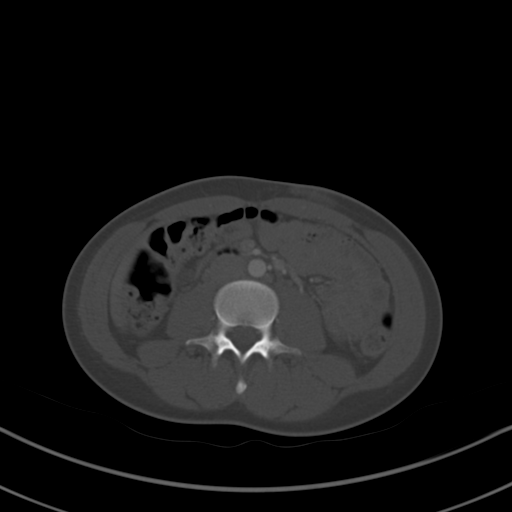
[im 55/80  soft-tissue]
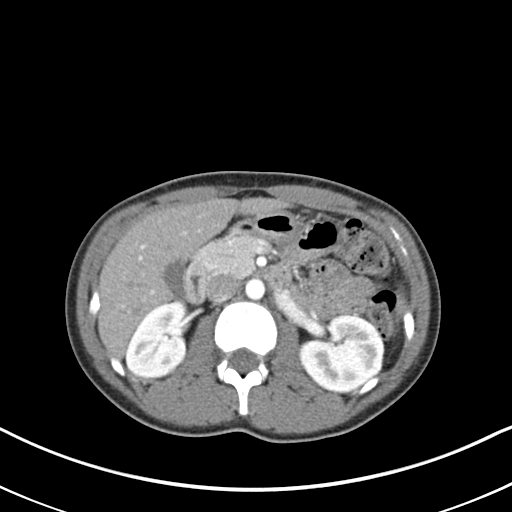
[im 59/80  soft-tissue]
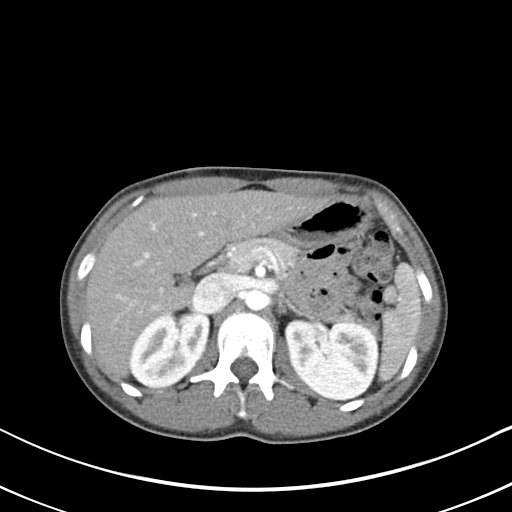
[im 63/80  soft-tissue]
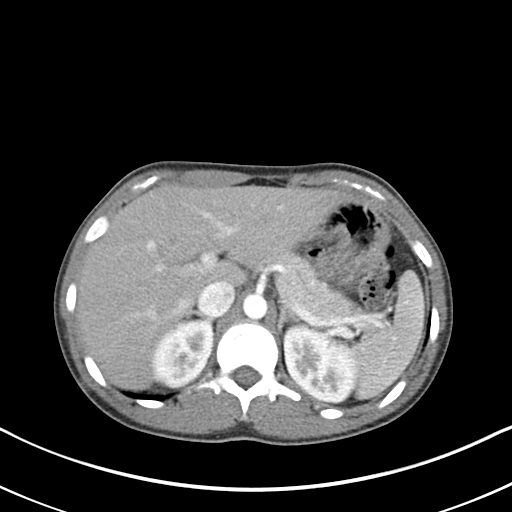
[im 71/80  soft-tissue]
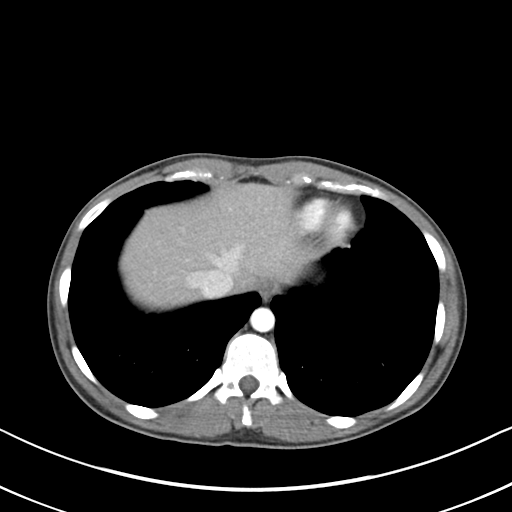
[im 75/80  soft-tissue]
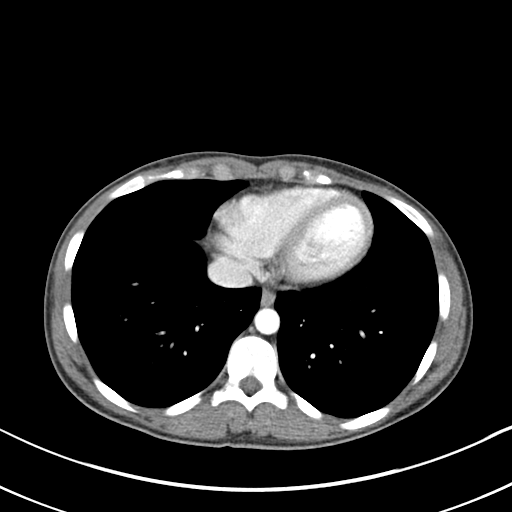

[Series 5: coronal st · coronal · 0.75mm/px · 3 of 89 slices shown]
[im 30/89  soft-tissue]
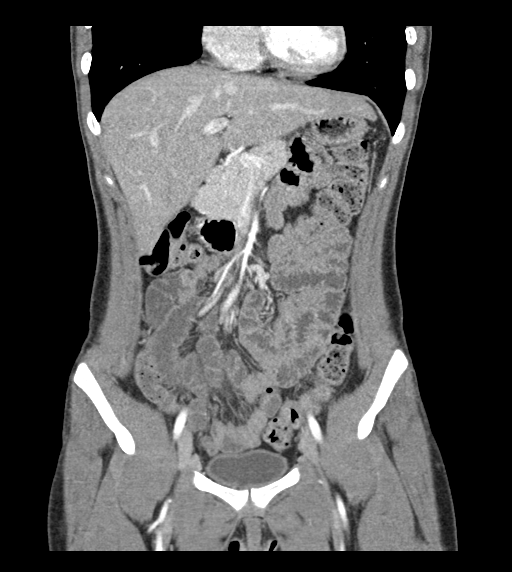
[im 40/89  soft-tissue]
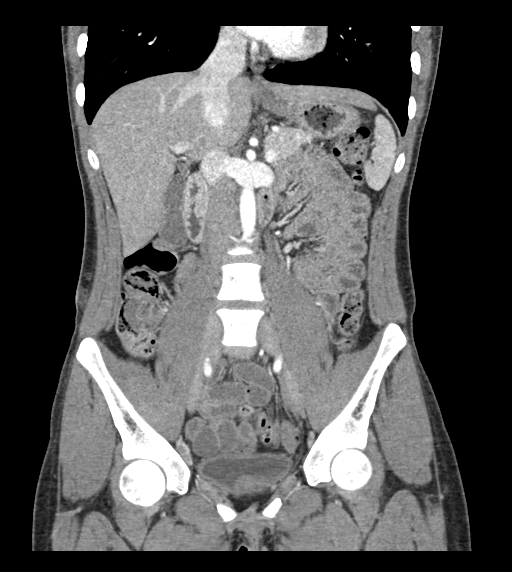
[im 49/89  soft-tissue]
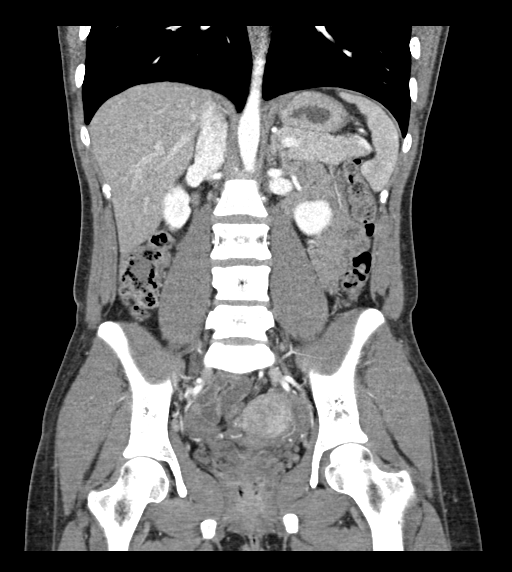

[17 of 46 positions shown; findings below may reference images not displayed]

FINDINGS: Lower chest: No acute abnormality.

Hepatobiliary: No focal liver abnormality is seen. No gallstones,
gallbladder wall thickening, or biliary dilatation.

Pancreas: Unremarkable. No pancreatic ductal dilatation or
surrounding inflammatory changes.

Spleen: Unremarkable

Adrenals/Urinary Tract: Adrenals, kidneys, and bladder are
unremarkable.

Stomach/Bowel: Stomach is within normal limits. Bowel is normal in
caliber. Normal appendix.

Vascular/Lymphatic: No significant vascular findings. No enlarged
lymph nodes.

Reproductive: Uterus and bilateral adnexa are unremarkable.

Other: There are patchy areas of infiltration of the subcutaneous
fat of the abdominopelvic wall and included thighs. For example,
more confluent areas within ventral abdominal wall near the
umbilicus (series 2, image 42).

Musculoskeletal: No acute or significant osseous abnormality
IMPRESSION: Patchy areas of infiltration of subcutaneous fat of the
abdominopelvic bowl and included upper thighs. For example, ventral
abdominal wall near the umbilicus. This is nonspecific but could be
infectious/inflammatory.

Otherwise unremarkable study.

## 2023-03-10 NOTE — Telephone Encounter (Signed)
ERROR

## 2023-03-10 NOTE — Telephone Encounter (Signed)
Pharmacy Patient Advocate Encounter   Received notification from CoverMyMeds that prior authorization for UBRELVY 100MG  is required/requested.   Insurance verification completed.   The patient is insured through [facility] MEDICAID .   Per test claim: PA required; PA submitted to above mentioned insurance via CoverMyMeds Key/confirmation #/EOC [id number] Status is pending

## 2023-03-15 ENCOUNTER — Other Ambulatory Visit: Payer: Self-pay | Admitting: Neurology

## 2023-03-15 DIAGNOSIS — G43009 Migraine without aura, not intractable, without status migrainosus: Secondary | ICD-10-CM

## 2023-03-21 ENCOUNTER — Telehealth: Payer: Self-pay

## 2023-03-21 NOTE — Telephone Encounter (Signed)
PA request has been Approved. New Encounter created for follow up. For additional info see Pharmacy Prior Auth telephone encounter from 11/25.

## 2023-03-21 NOTE — Telephone Encounter (Signed)
Pharmacy Patient Advocate Encounter  Received notification from [facility] that Prior Authorization for Ubrelvy 100MG  tablets has been APPROVED from 03/11/2023 to 03/09/2024

## 2023-04-07 NOTE — Progress Notes (Deleted)
 Office Visit Note  Patient: Alexandria Russell             Date of Birth: 2001/02/27           MRN: [id number]             PCP: Venia Minks, NP Referring: Venia Minks, NP Visit Date: 04/21/2023   Subjective:  No chief complaint on file.   History of Present Illness: Alexandria Russell is a 22 y.o. 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    Previous HPI 10/07/2022 Alexandria Russell is a 22 y.o. 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  Symptoms are doing pretty well overall.  Has ongoing scalp rash no new areas of hair loss.  It is not particularly painful or itchy, still has some posterior nodules and swelling.  No new skin lesions.  She saw plastic surgery to discuss any options for cosmetic effect of the subcutaneous fat atrophy at multiple areas was not recommended any procedural intervention at this time.     Previous HPI 06/04/2022 Alexandria Russell is a 22 y.o. 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  She has not noticed any new areas of painful subcutaneous nodules or swelling.  Still gets some tender areas on the skin without significant discoloration.  Ongoing mild scalp irritation or scaling and has some cervical lymphadenopathy but not painful.  She has had some episodes of shaking or tremor in her hands lasting several minutes at a time but has not noticed any specific trigger for these.   Previous HPI 02/19/22 Alexandria Russell is a 23 y.o. 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  She has been doing okay on these medications without any major intolerance problem.  Still having some superficial tender areas on the skin.  She saw dermatology and was prescribed triamcinolone ointment.   She saw neurology for the new headache problems and had head imaging with no intracranial problem of note did have some scalp changes consistent with cutaneous lesions.   Previous HPI 11/27/2021 Alexandria Russell is a 22 y.o. 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  Since her last visit she is having some increased symptoms in multiple areas.  Has increased joint pain affecting hands and wrists most commonly after she exerts herself more heavily such as having to move and lift a lot of objects for work.  She is having ongoing trouble with migraine headaches these are associated with light sensitivity and gets pain lateral to the eye sometimes affects both sides.  She was treated for asymptomatic chlamydia infection detected on STI screening in June finished antibiotics for this completely.  We had to switch her to oral methotrexate due to unavailability of the subcutaneous dosing.  She is reporting lower abdominal pain and GI intolerance worse for up to about 3 days of the week when taking her methotrexate.   Previous HPI 08/29/2021 Alexandria Russell is a 22 y.o. 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 She has some ongoing pain in the backs of her legs in previously affected areas. She notices some pain between the thumb and  index finger of both hands without visible changes. Her back pain and muscle spasm improved with as needed use of flexeril. She is scheduled for EGD/colonoscopy tomorrow for dysphagia symptoms.   Previous HPI 07/31/2021 Alexandria Russell is a 22 y.o. 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 She has been doing fairly well but with increased pain and stiffness in her upper arms and upper legs in past 2 weeks. She notices mildly tender right sided cervical lymph node  swelling painful to lie or turn to that side. She had some upper respiratory symptoms she thinks were seasonal allergies with some drainage. Fatigue is doing well. Still having frequent abdominal pain and feels like the overlying skin change and swelling is no better.   Previous HPI 05/02/21 Alexandria Russell is a 22 y.o. 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 So far not a dramatic change in skin inflammatory changes but face and leg improving, with still areas of raised nodules and recessed skin areas especially on her trunk. No specific intolerance of the HCQ.   Previous HPI 02/21/21 Alexandria Russell is a 22 y.o. 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 She was originally diagnosed about 2 years ago with initial steroid treatment of about 6 months. Primary symptoms at that time include joint pains in multiple sites and skin rashes on the face and nodular skin rashes on her torso and limbs. Diagnosis including skin biopsy taken from the leg and arm and breast. She was treated with imuran and hydroxychloroquine apparently stopped imuran due to nausea and switch to mycophenolate. She stopped taking all medications due to feeling quite sick on the combination and did not follow up regularly for about 2 years. She experienced several flares of symptom worsening with joint pain and pain over areas of skin swelling and redness usually treated with short term steroid treatments. She moved from [place] area to [place] around the start of this year and established with local physician treated with oral prednisone 5mg  daily at this time partially controlling symptoms. She last had an episode of increased symptoms about 3 weeks ago. She is also taking high dose weekly vitamin d supplementation for low vitamin D. Besides the panniculitis symptoms she also reports patchy alopecia on the scalp and on temporal areas associated with posterior cervical  adenopathy. She does notice symptoms increased with high amounts of sun exposure. She has lost about 10-20 pounds since this all started but stable during the past year. She denies any raynaud's symptoms or any history of blood clots.   Labs reviewed 12/2020 Vit D 15.0   05/2018 ANA 1:160 TPMT 18.9   No Rheumatology ROS completed.   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   Objective: Vital Signs: There were no vitals taken for this visit.   Physical Exam   Musculoskeletal Exam: ***  CDAI Exam: CDAI Score: -- Patient Global: --; Provider Global: -- Swollen: --; Tender: -- Joint Exam 04/21/2023   No joint exam has been documented for this visit   There is currently no information documented on the homunculus. Go to the Rheumatology activity and complete the homunculus joint exam.  Investigation: No additional findings.  Imaging: No results found.  Recent Labs: Lab Results  Component Value Date   WBC 5.6 10/07/2022   HGB 13.4 10/07/2022   PLT 207 10/07/2022   NA 140 10/07/2022   K 4.0 10/07/2022   CL 104 10/07/2022   CO2 27 10/07/2022   GLUCOSE 64 (L) 10/07/2022   BUN 7 10/07/2022   CREATININE 0.65 10/07/2022   BILITOT 0.3 10/07/2022   ALKPHOS 79 12/29/2020   AST 16 10/07/2022   ALT 9 10/07/2022   PROT  7.3 10/07/2022   ALBUMIN 4.2 12/29/2020   CALCIUM 9.6 10/07/2022    Speciality Comments: PLQ Eye Exam: 12/06/2021 WNL @ [facility] Follow up in 1 year  Procedures:  No procedures performed Allergies: Penicillins   Assessment / Plan:     Visit Diagnoses: No diagnosis found.  ***  Orders: No orders of the defined types were placed in this encounter.  No orders of the defined types were placed in this encounter.    Follow-Up Instructions: No follow-ups on file.   Metta Clines, RT  Note - This record has been created using [employer].  Chart creation errors have been sought, but may not always  have been located. Such creation errors do not reflect on  the standard of medical care.

## 2023-04-18 NOTE — Telephone Encounter (Signed)
 Alexandria Russell

## 2023-04-21 ENCOUNTER — Ambulatory Visit: Payer: Medicaid Other | Admitting: Internal Medicine

## 2023-04-21 ENCOUNTER — Encounter: Payer: Self-pay | Admitting: Neurology

## 2023-04-21 DIAGNOSIS — E559 Vitamin D deficiency, unspecified: Secondary | ICD-10-CM

## 2023-04-21 DIAGNOSIS — Z79899 Other long term (current) drug therapy: Secondary | ICD-10-CM

## 2023-04-21 DIAGNOSIS — L932 Other local lupus erythematosus: Secondary | ICD-10-CM

## 2023-04-24 ENCOUNTER — Ambulatory Visit
Admission: RE | Admit: 2023-04-24 | Discharge: 2023-04-24 | Disposition: A | Discharge status: Home / Self Care | Payer: Medicaid Other | Source: Ambulatory Visit | Attending: Neurology | Admitting: Neurology

## 2023-04-24 DIAGNOSIS — E348 Other specified endocrine disorders: Secondary | ICD-10-CM

## 2023-04-24 DIAGNOSIS — R519 Headache, unspecified: Secondary | ICD-10-CM | POA: Diagnosis not present

## 2023-04-24 MED ORDER — GADOPICLENOL 0.5 MMOL/ML IV SOLN
7.0000 mL | Freq: Once | INTRAVENOUS | Status: AC | PRN
  Administered 2023-04-24: 7 mL via INTRAVENOUS

## 2023-05-05 ENCOUNTER — Telehealth: Payer: Self-pay | Admitting: Neurology

## 2023-05-05 NOTE — Telephone Encounter (Signed)
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  Her headaches are doing better as well. She is only had 1-2 headaches of late.   She will call with new or worsening symptoms.  Jacquelyne Balint, MD [facility]

## 2023-05-29 ENCOUNTER — Ambulatory Visit: Payer: Medicaid Other | Admitting: Neurology

## 2023-06-02 NOTE — Progress Notes (Deleted)
 Office Visit Note  Patient: Alexandria Russell             Date of Birth: 02-05-2001           MRN: [id number]             PCP: Venia Minks, NP Referring: Venia Minks, NP Visit Date: 06/13/2023   Subjective:  No chief complaint on file.   History of Present Illness: Alexandria Russell is a 23 y.o. 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    Previous HPI 10/07/2022 Alexandria Russell is a 23 y.o. 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  Symptoms are doing pretty well overall.  Has ongoing scalp rash no new areas of hair loss.  It is not particularly painful or itchy, still has some posterior nodules and swelling.  No new skin lesions.  She saw plastic surgery to discuss any options for cosmetic effect of the subcutaneous fat atrophy at multiple areas was not recommended any procedural intervention at this time.     Previous HPI 06/04/2022 Alexandria Russell is a 23 y.o. 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  She has not noticed any new areas of painful subcutaneous nodules or swelling.  Still gets some tender areas on the skin without significant discoloration.  Ongoing mild scalp irritation or scaling and has some cervical lymphadenopathy but not painful.  She has had some episodes of shaking or tremor in her hands lasting several minutes at a time but has not noticed any specific trigger for these.   Previous HPI 02/19/22 Alexandria Russell is a 23 y.o. 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  She has been doing okay on these medications without any major intolerance problem.  Still having some superficial tender areas on the skin.  She saw dermatology and was prescribed triamcinolone ointment.   She saw neurology for the new headache problems and had head imaging with no intracranial problem of note did have some scalp changes consistent with cutaneous lesions.   Previous HPI 11/27/2021 Alexandria Russell is a 23 y.o. 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  Since her last visit she is having some increased symptoms in multiple areas.  Has increased joint pain affecting hands and wrists most commonly after she exerts herself more heavily such as having to move and lift a lot of objects for work.  She is having ongoing trouble with migraine headaches these are associated with light sensitivity and gets pain lateral to the eye sometimes affects both sides.  She was treated for asymptomatic chlamydia infection detected on STI screening in June finished antibiotics for this completely.  We had to switch her to oral methotrexate due to unavailability of the subcutaneous dosing.  She is reporting lower abdominal pain and GI intolerance worse for up to about 3 days of the week when taking her methotrexate.   Previous HPI 08/29/2021 Alexandria Russell is a 23 y.o. 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 She has some ongoing pain in the backs of her legs in previously affected areas. She notices some pain between the thumb and  index finger of both hands without visible changes. Her back pain and muscle spasm improved with as needed use of flexeril. She is scheduled for EGD/colonoscopy tomorrow for dysphagia symptoms.   Previous HPI 07/31/2021 Alexandria Russell is a 23 y.o. 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 She has been doing fairly well but with increased pain and stiffness in her upper arms and upper legs in past 2 weeks. She notices mildly tender right sided cervical lymph node  swelling painful to lie or turn to that side. She had some upper respiratory symptoms she thinks were seasonal allergies with some drainage. Fatigue is doing well. Still having frequent abdominal pain and feels like the overlying skin change and swelling is no better.   Previous HPI 05/02/21 Alexandria Russell is a 23 y.o. 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 So far not a dramatic change in skin inflammatory changes but face and leg improving, with still areas of raised nodules and recessed skin areas especially on her trunk. No specific intolerance of the HCQ.   Previous HPI 02/21/21 Alexandria Russell is a 23 y.o. 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 She was originally diagnosed about 2 years ago with initial steroid treatment of about 6 months. Primary symptoms at that time include joint pains in multiple sites and skin rashes on the face and nodular skin rashes on her torso and limbs. Diagnosis including skin biopsy taken from the leg and arm and breast. She was treated with imuran and hydroxychloroquine apparently stopped imuran due to nausea and switch to mycophenolate. She stopped taking all medications due to feeling quite sick on the combination and did not follow up regularly for about 2 years. She experienced several flares of symptom worsening with joint pain and pain over areas of skin swelling and redness usually treated with short term steroid treatments. She moved from [place] area to [place] around the start of this year and established with local physician treated with oral prednisone 5mg  daily at this time partially controlling symptoms. She last had an episode of increased symptoms about 3 weeks ago. She is also taking high dose weekly vitamin d supplementation for low vitamin D. Besides the panniculitis symptoms she also reports patchy alopecia on the scalp and on temporal areas associated with posterior cervical  adenopathy. She does notice symptoms increased with high amounts of sun exposure. She has lost about 10-20 pounds since this all started but stable during the past year. She denies any raynaud's symptoms or any history of blood clots.   Labs reviewed 12/2020 Vit D 15.0   05/2018 ANA 1:160 TPMT 18.9     No Rheumatology ROS completed.   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   Objective: Vital Signs: There were no vitals taken for this visit.   Physical Exam   Musculoskeletal Exam: ***  CDAI Exam: CDAI Score: -- Patient Global: --; Provider Global: -- Swollen: --; Tender: -- Joint Exam 06/13/2023   No joint exam has been documented for this visit   There is currently no information documented on the homunculus. Go to the Rheumatology activity and complete the homunculus joint exam.  Investigation: No additional findings.  Imaging: No results found.  Recent Labs: Lab Results  Component Value Date   WBC 5.6 10/07/2022   HGB 13.4 10/07/2022   PLT 207 10/07/2022   NA 140 10/07/2022   K 4.0 10/07/2022   CL 104 10/07/2022   CO2 27 10/07/2022   GLUCOSE 64 (L) 10/07/2022   BUN 7 10/07/2022   CREATININE 0.65 10/07/2022   BILITOT 0.3 10/07/2022   ALKPHOS 79 12/29/2020   AST 16 10/07/2022   ALT 9 10/07/2022    PROT 7.3 10/07/2022   ALBUMIN 4.2 12/29/2020   CALCIUM 9.6 10/07/2022    Speciality Comments: PLQ Eye Exam: 12/06/2021 WNL @ [facility] Follow up in 1 year  Procedures:  No procedures performed Allergies: Penicillins   Assessment / Plan:     Visit Diagnoses: No diagnosis found.  ***  Orders: No orders of the defined types were placed in this encounter.  No orders of the defined types were placed in this encounter.    Follow-Up Instructions: No follow-ups on file.   Metta Clines, RT  Note - This record has been created using [employer].  Chart creation errors have been sought, but may not always  have been located. Such creation errors do not reflect on  the standard of medical care.

## 2023-06-13 ENCOUNTER — Ambulatory Visit: Payer: Medicaid Other | Admitting: Internal Medicine

## 2023-06-16 ENCOUNTER — Other Ambulatory Visit: Payer: Self-pay | Admitting: Internal Medicine

## 2023-06-16 ENCOUNTER — Ambulatory Visit (INDEPENDENT_AMBULATORY_CARE_PROVIDER_SITE_OTHER): Payer: Medicaid Other | Admitting: Dermatology

## 2023-06-16 VITALS — BP 122/85

## 2023-06-16 DIAGNOSIS — L932 Other local lupus erythematosus: Secondary | ICD-10-CM

## 2023-06-16 DIAGNOSIS — E881 Lipodystrophy, not elsewhere classified: Secondary | ICD-10-CM | POA: Diagnosis not present

## 2023-06-16 DIAGNOSIS — L219 Seborrheic dermatitis, unspecified: Secondary | ICD-10-CM

## 2023-06-16 DIAGNOSIS — Z30013 Encounter for initial prescription of injectable contraceptive: Secondary | ICD-10-CM | POA: Diagnosis not present

## 2023-06-16 MED ORDER — CLOBETASOL PROPIONATE 0.05 % EX SOLN: 1.0000 | Freq: Two times a day (BID) | CUTANEOUS | 3 refills | Status: DC

## 2023-06-16 NOTE — Progress Notes (Unsigned)
   New Patient Visit   Subjective  Alexandria Russell is a 23 y.o. female who presents for the following: Itching of scalp with patches of hair loss that started and has been on and off since about 2022. She does notice that hair does come back but it comes back very fine then comes out again. In 2019, she was diagnosed with lupus and is stable on plaquenil and azathioprine. She has routine labs every 6 months with her rheumatologist. He says that her Vitamin B and D are low. She also has a spot on her stomach that she would like checked.    Accompanied by her boyfriend today.   The following portions of the chart were reviewed this encounter and updated as appropriate: medications, allergies, medical history  Review of Systems:  No other skin or systemic complaints except as noted in HPI or Assessment and Plan.  Objective  Well appearing patient in no apparent distress; mood and affect are within normal limits.   A focused examination was performed of the following areas:   Relevant exam findings are noted in the Assessment and Plan.    Assessment & Plan   CUTANEOUS LUPUS Exam:         Treatment Plan: Clobetasol solution twice daily  Recommend Vital Proteins Collagen Peptides.  SEBORRHEIC DERMATITIS Exam: Pink patches with greasy scale at scalp  Seborrheic Dermatitis is a chronic persistent rash characterized by pinkness and scaling most commonly of the mid face but also can occur on the scalp (dandruff), ears; mid chest, mid back and groin.  It tends to be exacerbated by stress and cooler weather.  People who have neurologic disease may experience new onset or exacerbation of existing seborrheic dermatitis.  The condition is not curable but treatable and can be controlled.  Treatment Plan: Recommend DHS Zinc shampoo     LIPOATROPHY OF ABDOMEN AND ARMS Exam: ***  Treatment Plan: ***    Return in about 4 months (around 10/16/2023) for Follow up.  I, Joanie Coddington,  CMA, am acting as scribe for [employer], DO .   Documentation: I have reviewed the above documentation for accuracy and completeness, and I agree with the above.  Langston Reusing, DO

## 2023-06-16 NOTE — Patient Instructions (Addendum)
 Hello Denyce Robert,  Thank you for visiting [place] today.   Here is a summary of the key instructions and recommendations from today's consultation:  Medication for Scalp: Start using topical clobetasol to address hair loss and inflammation. We also discussed the option of injecting clobetasol for more aggressive treatment, which can be considered based on the response to the topical treatment.  Shampoo: Use DHS Zinc shampoo for your seborrheic dermatitis. Application instructions are as follows:   Apply it first in the shower.   Let it sit for 3 minutes.   Rinse and condition afterward.  Supplements: Begin taking a collagen supplement, such as Vital Proteins, to support hair growth. It may take about 3 months to notice significant improvements.  Sun Protection: Use a physical blocker sunscreen daily, such as CeraVe or the [place] brand Instree, to protect your skin from UV rays due to increased sensitivity from lupus.  NSAIDs Caution: Avoid NSAIDs like ibuprofen, Advil, and Aleve as they can trigger lupus flares, especially when exposed to sunlight.  Follow-Up: Schedule a follow-up appointment in 3 months to monitor progress and adjust treatment as necessary.  Please do not hesitate to reach out if you have any questions or concerns before our next appointment.  Warm regards,  Dr. [facility]            Recommended Sunscreen      Important Information  Due to recent changes in healthcare laws, you may see results of your pathology and/or laboratory studies on MyChart before the doctors have had a chance to review them. We understand that in some cases there may be results that are confusing or concerning to you. Please understand that not all results are received at the same time and often the doctors may need to interpret multiple results in order to provide you with the best plan of care or course of treatment. Therefore, we ask that you please give [place] 2  business days to thoroughly review all your results before contacting the office for clarification. Should we see a critical lab result, you will be contacted sooner.   If You Need Anything After Your Visit  If you have any questions or concerns for your doctor, please call our main line at [phone] If no one answers, please leave a voicemail as directed and we will return your call as soon as possible. Messages left after 4 pm will be answered the following business day.   You may also send [place] a message via MyChart. We typically respond to MyChart messages within 1-2 business days.  For prescription refills, please ask your pharmacy to contact our office. Our fax number is [phone].  If you have an urgent issue when the clinic is closed that cannot wait until the next business day, you can page your doctor at the number below.    Please note that while we do our best to be available for urgent issues outside of office hours, we are not available 24/7.   If you have an urgent issue and are unable to reach [place], you may choose to seek medical care at your doctor's office, retail clinic, urgent care center, or emergency room.  If you have a medical emergency, please immediately call 911 or go to the emergency department. In the event of inclement weather, please call our main line at [phone] for an update on the status of any delays or closures.  Dermatology Medication Tips: Please keep the boxes that topical medications come  in in order to help keep track of the instructions about where and how to use these. Pharmacies typically print the medication instructions only on the boxes and not directly on the medication tubes.   If your medication is too expensive, please contact our office at [phone] or send [place] a message through MyChart.   We are unable to tell what your co-pay for medications will be in advance as this is different depending on your insurance coverage. However, we  may be able to find a substitute medication at lower cost or fill out paperwork to get insurance to cover a needed medication.   If a prior authorization is required to get your medication covered by your insurance company, please allow [place] 1-2 business days to complete this process.  Drug prices often vary depending on where the prescription is filled and some pharmacies may offer cheaper prices.  The website www.goodrx.com contains coupons for medications through different pharmacies. The prices here do not account for what the cost may be with help from insurance (it may be cheaper with your insurance), but the website can give you the price if you did not use any insurance.  - You can print the associated coupon and take it with your prescription to the pharmacy.  - You may also stop by our office during regular business hours and pick up a GoodRx coupon card.  - If you need your prescription sent electronically to a different pharmacy, notify our office through [facility] or by phone at [phone]

## 2023-06-19 ENCOUNTER — Encounter: Payer: Self-pay | Admitting: Dermatology
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
## 2023-06-27 ENCOUNTER — Encounter: Payer: Self-pay | Admitting: Neurology

## 2023-06-27 ENCOUNTER — Ambulatory Visit: Payer: Medicaid Other | Admitting: Neurology

## 2023-06-27 VITALS — BP 116/79 | HR 99 | Ht 60.0 in | Wt 136.0 lb

## 2023-06-27 DIAGNOSIS — R112 Nausea with vomiting, unspecified: Secondary | ICD-10-CM | POA: Diagnosis not present

## 2023-06-27 DIAGNOSIS — F40298 Other specified phobia: Secondary | ICD-10-CM | POA: Diagnosis not present

## 2023-06-27 DIAGNOSIS — G43009 Migraine without aura, not intractable, without status migrainosus: Secondary | ICD-10-CM

## 2023-06-27 DIAGNOSIS — E538 Deficiency of other specified B group vitamins: Secondary | ICD-10-CM | POA: Diagnosis not present

## 2023-06-27 DIAGNOSIS — E348 Other specified endocrine disorders: Secondary | ICD-10-CM

## 2023-06-27 DIAGNOSIS — Z79899 Other long term (current) drug therapy: Secondary | ICD-10-CM

## 2023-06-27 DIAGNOSIS — H53149 Visual discomfort, unspecified: Secondary | ICD-10-CM | POA: Diagnosis not present

## 2023-06-27 DIAGNOSIS — E559 Vitamin D deficiency, unspecified: Secondary | ICD-10-CM

## 2023-06-27 DIAGNOSIS — L932 Other local lupus erythematosus: Secondary | ICD-10-CM

## 2023-06-27 NOTE — Patient Instructions (Addendum)
 Migraine prevention:  Continue nortriptyline 10 mg at bedtime but can increase to 20 mg as prescribed if headache frequency increases Migraine rescue:  Continue Ubrelvy 100 mg as needed at headache onset Limit use of pain relievers to no more than 2 days out of week to prevent risk of rebound or medication-overuse headache. Keep headache diary  -Continue B12 1000 mcg daily -Continue vitamin D 1000 international units daily  Return to clinic in 1 year  The physicians and staff at [facility] are committed to providing excellent care. You may receive a survey requesting feedback about your experience at our office. We strive to receive "very good" responses to the survey questions. If you feel that your experience would prevent you from giving the office a "very good " response, please contact our office to try to remedy the situation. We may be reached at [phone]. Thank you for taking the time out of your busy day to complete the survey.  Jacquelyne Balint, MD [facility]

## 2023-07-03 DIAGNOSIS — Z79899 Other long term (current) drug therapy: Secondary | ICD-10-CM | POA: Diagnosis not present

## 2023-07-09 NOTE — Progress Notes (Signed)
 Office Visit Note  Patient: Alexandria Russell             Date of Birth: 2001-03-28           MRN: [id number]             PCP: Venia Minks, NP Referring: Venia Minks, NP Visit Date: 07/22/2023   Subjective:  Follow-up (Patient states she had an eye exam about two weeks ago and will have it faxed over. )   Discussed the use of AI scribe software for clinical note transcription with the patient, who gave verbal consent to proceed.  History of Present Illness   Alexandria Russell is a 23 y.o. 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    She has experienced increased hair loss on her scalp, despite no new skin rashes elsewhere. She has been using topical clobetasol solution twice daily for the past month, as prescribed by her dermatologist, and has noticed some hair regrowth.  She describes significant stomach pain, particularly in an area previously affected by lupus profundus, which has been more noticeable after eating over the past month. She reports increased bowel movements, up to three or four times a day, with occasional diarrhea and watery or mucousy stools. This has impacted her ability to work comfortably. No recent viral or respiratory infections, and no constipation, but she does experience diarrhea.  She mentions a change in sleep patterns, waking up around 3 AM and staying awake for about an hour before returning to sleep. She denies any recent illness or unusual dietary changes.  She has started taking collagen, hair, skin, and nails vitamins, and vitamin B12 in the past month. She acknowledges not taking vitamin D supplements recently, which was low last year.  She recalls a severe migraine a few weeks ago, accompanied by prolonged vomiting, which she associates with the onset of some of her symptoms. She notes a knot in  her chest that was previously painful but is not currently. No thigh pain.       Previous HPI 10/07/2022 Alexandria Russell is a 23 y.o. 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  Symptoms are doing pretty well overall.  Has ongoing scalp rash no new areas of hair loss.  It is not particularly painful or itchy, still has some posterior nodules and swelling.  No new skin lesions.  She saw plastic surgery to discuss any options for cosmetic effect of the subcutaneous fat atrophy at multiple areas was not recommended any procedural intervention at this time.     Previous HPI 06/04/2022 Alexandria Russell is a 22 y.o. 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  She has not noticed any new areas of painful subcutaneous nodules or swelling.  Still gets some tender areas on the skin without significant discoloration.  Ongoing mild scalp irritation or scaling and has some cervical lymphadenopathy but not painful.  She has had some episodes of shaking or tremor in her hands lasting several minutes at a time but has not noticed any specific trigger for these.   Previous HPI 02/19/22 Alexandria Russell is a 23 y.o. 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  She has been doing okay on these medications without any major intolerance problem.  Still having some superficial tender areas on the skin.  She saw dermatology and was prescribed triamcinolone ointment.  She saw neurology for the new headache problems and had head imaging with no intracranial problem of note did have some scalp changes consistent with cutaneous lesions.   Previous HPI 11/27/2021 Alexandria Russell is a 23 y.o. 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  Since her last visit she is having some increased  symptoms in multiple areas.  Has increased joint pain affecting hands and wrists most commonly after she exerts herself more heavily such as having to move and lift a lot of objects for work.  She is having ongoing trouble with migraine headaches these are associated with light sensitivity and gets pain lateral to the eye sometimes affects both sides.  She was treated for asymptomatic chlamydia infection detected on STI screening in June finished antibiotics for this completely.  We had to switch her to oral methotrexate due to unavailability of the subcutaneous dosing.  She is reporting lower abdominal pain and GI intolerance worse for up to about 3 days of the week when taking her methotrexate.   Previous HPI 08/29/2021 Alexandria Russell is a 23 y.o. female here for follow up for lupus profundus inflammatory changes currently on HCQ 200 mg daily and methotrexate 15 mg [place] weekly and folic acid 1 mg daily. She has some ongoing pain in the backs of her legs in previously affected areas. She notices some pain between the thumb and index finger of both hands without visible changes. Her back pain and muscle spasm improved with as needed use of flexeril. She is scheduled for EGD/colonoscopy tomorrow for dysphagia symptoms.   Previous HPI 07/31/2021 Alexandria Russell is a 23 y.o. 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 She has been doing fairly well but with increased pain and stiffness in her upper arms and upper legs in past 2 weeks. She notices mildly tender right sided cervical lymph node swelling painful to lie or turn to that side. She had some upper respiratory symptoms she thinks were seasonal allergies with some drainage. Fatigue is doing well. Still having frequent abdominal pain and feels like the overlying skin change and swelling is no better.   Previous HPI 05/02/21 Alexandria Russell is a 23 y.o. female here for follow  up for lupus profundus after starting HCQ 200 mg daily and continuing prednisone 5 mg daily. So far not a dramatic change in skin inflammatory changes but face and leg improving, with still areas of raised nodules and recessed skin areas especially on her trunk. No specific intolerance of the HCQ.   Previous HPI 02/21/21 Cinderella Christoffersen is a 23 y.o. 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 She was originally diagnosed about 2 years ago with initial steroid treatment of about 6 months. Primary symptoms at that time include joint pains in multiple sites and skin rashes on the face and nodular skin rashes on her torso and limbs. Diagnosis including skin biopsy taken from the leg and arm and breast. She was treated with imuran and hydroxychloroquine apparently stopped imuran due to nausea and switch to mycophenolate. She stopped taking all medications due to feeling quite sick on the combination and did not follow up regularly for about 2 years. She experienced several flares of symptom worsening with joint pain and pain  over areas of skin swelling and redness usually treated with short term steroid treatments. She moved from [place] area to [place] around the start of this year and established with local physician treated with oral prednisone 5mg  daily at this time partially controlling symptoms. She last had an episode of increased symptoms about 3 weeks ago. She is also taking high dose weekly vitamin d supplementation for low vitamin D. Besides the panniculitis symptoms she also reports patchy alopecia on the scalp and on temporal areas associated with posterior cervical adenopathy. She does notice symptoms increased with high amounts of sun exposure. She has lost about 10-20 pounds since this all started but stable during the past year. She denies any raynaud's symptoms or any history of blood clots.   Labs reviewed 12/2020 Vit D 15.0   05/2018 ANA 1:160 TPMT 18.9     Review of Systems  Constitutional:   Negative for fatigue.  HENT:  Negative for mouth sores and mouth dryness.   Eyes:  Negative for dryness.  Respiratory:  Negative for shortness of breath.   Cardiovascular:  Positive for chest pain. Negative for palpitations.  Gastrointestinal:  Positive for diarrhea. Negative for blood in stool and constipation.  Endocrine: Negative for increased urination.  Genitourinary:  Negative for involuntary urination.  Musculoskeletal:  Positive for joint swelling. Negative for joint pain, gait problem, joint pain, myalgias, muscle weakness, morning stiffness, muscle tenderness and myalgias.  Skin:  Positive for hair loss and sensitivity to sunlight. Negative for color change and rash.  Allergic/Immunologic: Positive for susceptible to infections.  Neurological:  Positive for dizziness and headaches.  Hematological:  Positive for swollen glands.  Psychiatric/Behavioral:  Positive for sleep disturbance. Negative for depressed mood. The patient is not nervous/anxious.     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   Objective: Vital Signs: BP 104/70 (BP Location: Left Arm, Patient Position: Sitting, Cuff Size: Normal)   Pulse 81   Resp 12   Ht 5' (1.524 m)   Wt 135 lb (61.2 kg)   BMI 26.37 kg/m    Physical Exam HENT:     Mouth/Throat:      Mouth: Mucous membranes are moist.     Pharynx: Oropharynx is clear.  Eyes:     Conjunctiva/sclera: Conjunctivae normal.  Neck:     Comments: Large bilateral posterior cervical adenopathy from posterior auricular along deep and posterior chain Cardiovascular:     Rate and Rhythm: Normal rate and regular rhythm.  Pulmonary:     Effort: Pulmonary effort is normal.     Breath sounds: Normal breath sounds.  Musculoskeletal:     Right lower leg: No edema.     Left lower leg: No edema.  Lymphadenopathy:     Cervical: Cervical adenopathy present.  Skin:    General: Skin is warm and dry.     Findings: Rash present.     Comments: Fat atrophy on upper arms b/l, on left thigh Fat atrophy at umbilicus, mild tenderness to pressure on skin along posterior edge of atrophic area, faint hyperpigmentation and warmth  Neurological:     Mental Status: She is alert.  Psychiatric:        Mood and Affect: Mood normal.      Musculoskeletal Exam:  Shoulders full ROM no tenderness or swelling Elbows full ROM no tenderness or swelling Wrists full ROM no tenderness or swelling Fingers full ROM no tenderness or swelling Knees full ROM no tenderness or swelling   Investigation: No additional findings.  Imaging: No results found.  Recent Labs: Lab Results  Component Value Date   WBC 5.6 10/07/2022   HGB 13.4 10/07/2022   PLT 207 10/07/2022   NA 140 10/07/2022   K 4.0 10/07/2022   CL 104 10/07/2022   CO2 27 10/07/2022   GLUCOSE 64 (L) 10/07/2022   BUN 7 10/07/2022   CREATININE 0.65 10/07/2022   BILITOT 0.3 10/07/2022   ALKPHOS 79 12/29/2020   AST 16 10/07/2022   ALT 9 10/07/2022   PROT 7.3 10/07/2022   ALBUMIN 4.2 12/29/2020   CALCIUM 9.6 10/07/2022    Speciality Comments: PLQ Eye Exam: 12/06/2021 WNL @ [facility] Follow up in 1 year  -Patient states she had an eye exam about two weeks ago and will have it faxed over.   Procedures:  No procedures performed Allergies:  Penicillins   Assessment / Plan:     Visit Diagnoses: Lupus profundus - Plan: Sedimentation rate, C3 and C4, Anti-DNA antibody, double-stranded, hydroxychloroquine (PLAQUENIL) 200 MG tablet Cutaneous lupus erythematosus Scalp hair loss managed with clobetasol, some regrowth noted. No new rashes. Advised on UV protection. Not on vitamin D, important due to sun avoidance. - Continue clobetasol application twice daily. - Initiate vitamin D supplementation amount based on lab result today. - Advise on sun protection with SPF 30 or higher. - Resume HCQ 200 mg daily more consistently - If inflammatory markers raised would recommend short steroid taper and consider resuming AZA vs monitor on monotherapy for now  High risk medication use - hydroxychloroquine 200 mg and azathioprine 50 mg daily. PLQ Eye Exam: 12/06/2021, needs updated PLQ eye exam. - Plan: CBC with Differential/Platelet, Comprehensive metabolic panel with GFR No serious major interval infections. Has been on decreased/interrupted medications due to late f/u. -Checking CBC and CMP for medication monitoring - Reports recent eye exam unremarkable, will f/u on results for this  Vitamin D deficiency - Plan: VITAMIN D 25 Hydroxy (Vit-D Deficiency, Fractures)  Enlarged posterior cervical lymph nodes Symmetrical, non-tender nodes likely due to scalp inflammation from lupus. Reduced malignancy concern. Explained lymphatic drainage role. - Monitor lymph node size and symptoms.  Abdominal pain with diarrhea Increased postprandial abdominal pain and diarrhea. Possible link to collagen and vitamin B12 supplements. Plan to evaluate with blood tests. - Order blood tests for electrolytes and inflammatory markers.    Orders: Orders Placed This Encounter  Procedures   Sedimentation rate   C3 and C4   Anti-DNA antibody, double-stranded   VITAMIN D 25 Hydroxy (Vit-D Deficiency, Fractures)   CBC with Differential/Platelet   Comprehensive  metabolic panel with GFR   Meds ordered this encounter  Medications   hydroxychloroquine (PLAQUENIL) 200 MG tablet  Sig: Take 1 tablet (200 mg total) by mouth daily.    Dispense:  90 tablet    Refill:  1     Follow-Up Instructions: Return in about 3 months (around 10/21/2023) for Lupus Profundus on HCQ f/u 3mos.   Fuller Plan, MD  Note - This record has been created using [employer].  Chart creation errors have been sought, but may not always  have been located. Such creation errors do not reflect on  the standard of medical care.

## 2023-07-22 ENCOUNTER — Encounter: Payer: Self-pay | Admitting: Internal Medicine

## 2023-07-22 ENCOUNTER — Ambulatory Visit: Payer: Medicaid Other | Attending: Internal Medicine | Admitting: Internal Medicine

## 2023-07-22 VITALS — BP 104/70 | HR 81 | Resp 12 | Ht 60.0 in | Wt 135.0 lb

## 2023-07-22 DIAGNOSIS — E559 Vitamin D deficiency, unspecified: Secondary | ICD-10-CM

## 2023-07-22 DIAGNOSIS — L932 Other local lupus erythematosus: Secondary | ICD-10-CM | POA: Diagnosis not present

## 2023-07-22 DIAGNOSIS — Z79899 Other long term (current) drug therapy: Secondary | ICD-10-CM

## 2023-07-22 MED ORDER — HYDROXYCHLOROQUINE SULFATE 200 MG PO TABS: 200.0000 mg | ORAL_TABLET | Freq: Every day | ORAL | 1 refills | Status: DC

## 2023-07-23 LAB — COMPREHENSIVE METABOLIC PANEL WITH GFR
AG Ratio: 1.6 (calc) (ref 1.0–2.5)
ALT: 8 U/L (ref 6–29)
AST: 16 U/L (ref 10–30)
Albumin: 5 g/dL (ref 3.6–5.1)
Alkaline phosphatase (APISO): 101 U/L (ref 31–125)
BUN/Creatinine Ratio: 9 (calc) (ref 6–22)
BUN: 6 mg/dL — ABNORMAL LOW (ref 7–25)
CO2: 28 mmol/L (ref 20–32)
Calcium: 9.4 mg/dL (ref 8.6–10.2)
Chloride: 103 mmol/L (ref 98–110)
Creat: 0.65 mg/dL (ref 0.50–0.96)
Globulin: 3.1 g/dL (ref 1.9–3.7)
Glucose, Bld: 66 mg/dL (ref 65–99)
Potassium: 3.6 mmol/L (ref 3.5–5.3)
Sodium: 140 mmol/L (ref 135–146)
Total Bilirubin: 0.4 mg/dL (ref 0.2–1.2)
Total Protein: 8.1 g/dL (ref 6.1–8.1)
eGFR: 128 mL/min/{1.73_m2} (ref >=60)

## 2023-07-23 LAB — SEDIMENTATION RATE: Sed Rate: 6 mm/h (ref 0–20)

## 2023-07-23 LAB — CBC WITH DIFFERENTIAL/PLATELET
Absolute Lymphocytes: 2388 {cells}/uL (ref 850–3900)
Absolute Monocytes: 467 {cells}/uL (ref 200–950)
Basophils Absolute: 17 {cells}/uL (ref 0–200)
Basophils Relative: 0.3 %
Eosinophils Absolute: 11 {cells}/uL — ABNORMAL LOW (ref 15–500)
Eosinophils Relative: 0.2 %
HCT: 44.1 % (ref 35.0–45.0)
Hemoglobin: 14.2 g/dL (ref 11.7–15.5)
MCH: 27.2 pg (ref 27.0–33.0)
MCHC: 32.2 g/dL (ref 32.0–36.0)
MCV: 84.3 fL (ref 80.0–100.0)
MPV: 10.9 fL (ref 7.5–12.5)
Monocytes Relative: 8.2 %
Neutro Abs: 2816 {cells}/uL (ref 1500–7800)
Neutrophils Relative %: 49.4 %
Platelets: 218 10*3/uL (ref 140–400)
RBC: 5.23 10*6/uL — ABNORMAL HIGH (ref 3.80–5.10)
RDW: 13.2 % (ref 11.0–15.0)
Total Lymphocyte: 41.9 %
WBC: 5.7 10*3/uL (ref 3.8–10.8)

## 2023-07-23 LAB — ANTI-DNA ANTIBODY, DOUBLE-STRANDED: ds DNA Ab: <1 [IU]/mL

## 2023-07-23 LAB — C3 AND C4
C3 Complement: 149 mg/dL (ref 83–193)
C4 Complement: 26 mg/dL (ref 15–57)

## 2023-07-23 LAB — VITAMIN D 25 HYDROXY (VIT D DEFICIENCY, FRACTURES): Vit D, 25-Hydroxy: 14 ng/mL — ABNORMAL LOW (ref 30–100)

## 2023-08-11 ENCOUNTER — Ambulatory Visit: Admitting: Internal Medicine

## 2023-08-11 NOTE — Progress Notes (Deleted)
 Office Visit Note  Patient: Alexandria Russell             Date of Birth: Oct 23, 2000           MRN: [id number]             PCP: Zigmund Hills, NP Referring: Zigmund Hills, NP Visit Date: 08/11/2023   Subjective:  No chief complaint on file.   History of Present Illness: Alexandria Russell is a 23 y.o. female here for follow up ***   Previous HPI 07/22/23 [name] is a 23 y.o. 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     She has experienced increased hair loss on her scalp, despite no new skin rashes elsewhere. She has been using topical clobetasol  solution twice daily for the past month, as prescribed by her dermatologist, and has noticed some hair regrowth.   She describes significant stomach pain, particularly in an area previously affected by lupus profundus, which has been more noticeable after eating over the past month. She reports increased bowel movements, up to three or four times a day, with occasional diarrhea and watery or mucousy stools. This has impacted her ability to work comfortably. No recent viral or respiratory infections, and no constipation, but she does experience diarrhea.   She mentions a change in sleep patterns, waking up around 3 AM and staying awake for about an hour before returning to sleep. She denies any recent illness or unusual dietary changes.   She has started taking collagen, hair, skin, and nails vitamins, and vitamin B12 in the past month. She acknowledges not taking vitamin D  supplements recently, which was low last year.   She recalls a severe migraine a few weeks ago, accompanied by prolonged vomiting, which she associates with the onset of some of her symptoms. She notes a knot in her chest that was previously painful but is not currently. No thigh pain.         Previous  HPI 10/07/2022 Alexandria Russell is a 23 y.o. 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  Symptoms are doing pretty well overall.  Has ongoing scalp rash no new areas of hair loss.  It is not particularly painful or itchy, still has some posterior nodules and swelling.  No new skin lesions.  She saw plastic surgery to discuss any options for cosmetic effect of the subcutaneous fat atrophy at multiple areas was not recommended any procedural intervention at this time.     Previous HPI 06/04/2022 Alexandria Russell is a 23 y.o. 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  She has not noticed any new areas of painful subcutaneous nodules or swelling.  Still gets some tender areas on the skin without significant discoloration.  Ongoing mild scalp irritation or scaling and has some cervical lymphadenopathy but not painful.  She has had some episodes of shaking or tremor in her hands lasting several minutes at a time but has not noticed any specific trigger for these.   Previous HPI 02/19/22 Alexandria Russell is a 23 y.o. 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  She has been doing okay on these medications without any major intolerance problem.  Still having some superficial tender areas on the skin.  She saw dermatology and was prescribed triamcinolone  ointment.  She saw neurology for the new headache problems and had head imaging with no intracranial problem of note did have some scalp changes consistent with cutaneous lesions.   Previous HPI 11/27/2021 Alexandria Russell is a 23 y.o. 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  Since her last visit she is having some increased symptoms in multiple areas.  Has increased joint pain affecting hands and wrists most commonly  after she exerts herself more heavily such as having to move and lift a lot of objects for work.  She is having ongoing trouble with migraine headaches these are associated with light sensitivity and gets pain lateral to the eye sometimes affects both sides.  She was treated for asymptomatic chlamydia infection detected on STI screening in June finished antibiotics for this completely.  We had to switch her to oral methotrexate  due to unavailability of the subcutaneous dosing.  She is reporting lower abdominal pain and GI intolerance worse for up to about 3 days of the week when taking her methotrexate .   Previous HPI 08/29/2021 Alexandria Russell is a 23 y.o. 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 She has some ongoing pain in the backs of her legs in previously affected areas. She notices some pain between the thumb and index finger of both hands without visible changes. Her back pain and muscle spasm improved with as needed use of flexeril . She is scheduled for EGD/colonoscopy tomorrow for dysphagia symptoms.   Previous HPI 07/31/2021 Alexandria Russell is a 23 y.o. 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 She has been doing fairly well but with increased pain and stiffness in her upper arms and upper legs in past 2 weeks. She notices mildly tender right sided cervical lymph node swelling painful to lie or turn to that side. She had some upper respiratory symptoms she thinks were seasonal allergies with some drainage. Fatigue is doing well. Still having frequent abdominal pain and feels like the overlying skin change and swelling is no better.   Previous HPI 05/02/21 Alexandria Russell is a 23 y.o. 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 So  far not a dramatic change in skin inflammatory changes but face and leg improving, with still areas of raised nodules and recessed skin areas especially on her trunk. No specific intolerance of the HCQ.   Previous HPI 02/21/21 Sayla Kalen is a 23 y.o. 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 She was originally diagnosed about 2 years ago with initial steroid treatment of about 6 months. Primary symptoms at that time include joint pains in multiple sites and skin rashes on the face and nodular skin rashes on her torso and limbs. Diagnosis including skin biopsy taken from the leg and arm and breast. She was treated with imuran  and hydroxychloroquine  apparently stopped imuran  due to nausea and switch to mycophenolate. She stopped taking all medications due to feeling quite sick on the combination and did not follow up regularly for about 2 years. She experienced several flares of symptom worsening with joint pain and pain over areas of skin swelling and redness usually treated with short term steroid treatments.  She moved from [place] area to [place] around the start of this year and established with local physician treated with oral prednisone  5mg  daily at this time partially controlling symptoms. She last had an episode of increased symptoms about 3 weeks ago. She is also taking high dose weekly vitamin d  supplementation for low vitamin D . Besides the panniculitis symptoms she also reports patchy alopecia on the scalp and on temporal areas associated with posterior cervical adenopathy. She does notice symptoms increased with high amounts of sun exposure. She has lost about 10-20 pounds since this all started but stable during the past year. She denies any raynaud's symptoms or any history of blood clots.   Labs reviewed 12/2020 Vit D 15.0   05/2018 ANA 1:160 TPMT 18.9   No Rheumatology ROS completed.   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   Objective: Vital Signs: There were no vitals taken for this visit.   Physical Exam   Musculoskeletal Exam: ***  CDAI Exam: CDAI Score: -- Patient Global: --; Provider Global: -- Swollen: --; Tender: -- Joint Exam 08/11/2023   No joint exam has been documented for this visit   There is currently no information documented on the homunculus. Go to the Rheumatology activity and complete the homunculus joint exam.  Investigation: No additional findings.  Imaging: No results found.  Recent Labs: Lab Results  Component Value Date   WBC 5.7 07/22/2023   HGB 14.2 07/22/2023   PLT 218 07/22/2023   NA 140 07/22/2023   K 3.6 07/22/2023   CL 103 07/22/2023   CO2 28 07/22/2023   GLUCOSE 66 07/22/2023   BUN 6 (L) 07/22/2023   CREATININE 0.65 07/22/2023   BILITOT 0.4 07/22/2023   ALKPHOS 79 12/29/2020   AST 16 07/22/2023   ALT 8 07/22/2023   PROT 8.1 07/22/2023   ALBUMIN 4.2 12/29/2020   CALCIUM 9.4 07/22/2023    Speciality Comments: PLQ Eye Exam: 12/06/2021 WNL @ [facility] Follow up in 1 year  -Patient states she had an eye exam about two weeks ago and will have it faxed over.   Procedures:  No procedures performed Allergies: Penicillins   Assessment / Plan:     Visit Diagnoses: No diagnosis found.  ***  Orders: No orders of the defined types were  placed in this encounter.  No orders of the defined types were placed in this encounter.    Follow-Up Instructions: No follow-ups on file.   Matt Song, MD  Note - This record has been created using [employer].  Chart creation errors have been sought, but may not always  have been located. Such creation errors do not reflect on  the standard of  medical care.

## 2023-09-01 DIAGNOSIS — Z30013 Encounter for initial prescription of injectable contraceptive: Secondary | ICD-10-CM | POA: Diagnosis not present

## 2023-10-14 DIAGNOSIS — E162 Hypoglycemia, unspecified: Secondary | ICD-10-CM | POA: Insufficient documentation

## 2023-10-15 NOTE — Progress Notes (Signed)
 Office Visit Note  Patient: Alexandria Russell             Date of Birth: 2000/05/19           MRN: [id number]             PCP: Loring Leeds, NP Referring: Loring Leeds, NP Visit Date: 10/28/2023   Subjective:  Follow-up (Patient states she developed hives after a family vacation. Patient states she also feels like her blood sugar is low in the mornings. Patient states she wakes up shaking and a slight headache with nausea. )   Discussed the use of AI scribe software for clinical note transcription with the patient, who gave verbal consent to proceed.  History of Present Illness   Alexandria Russell is a 23 y.o. 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 She presents with a recent episode of hives and skin rashes.  She experienced an episode of hives and skin rashes on [place] 5th or [place] The rashes appeared on her arms, legs, and stomach, were itchy but not painful, and lasted for about three to four days. She was exposed to significant sun during her trip but reports consistently using sunscreen, and wonders if the sun exposure could have been a trigger.  She has been using a topical steroid for scalp rashes as prescribed by her dermatologist, along with minoxidil, but has not noticed significant improvement. She is taking Plaquenil  consistently without any issues.  In the past few days, she has experienced joint pain and stiffness in her neck, which she attributes to doing her hair. No recent viral illnesses or the need for antibiotics.   Previous HPI 07/22/2023 Alexandria Russell is a 23 y.o. 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     She has experienced increased hair loss on her scalp, despite no new skin rashes elsewhere. She has been using topical  clobetasol  solution twice daily for the past month, as prescribed by her dermatologist, and has noticed some hair regrowth.   She describes significant stomach pain, particularly in an area previously affected by lupus profundus, which has been more noticeable after eating over the past month. She reports increased bowel movements, up to three or four times a day, with occasional diarrhea and watery or mucousy stools. This has impacted her ability to work comfortably. No recent viral or respiratory infections, and no constipation, but she does experience diarrhea.   She mentions a change in sleep patterns, waking up around 3 AM and staying awake for about an hour before returning to sleep. She denies any recent illness or unusual dietary changes.   She has started taking collagen, hair, skin, and nails vitamins, and vitamin B12 in the past month. She acknowledges not taking vitamin D  supplements recently, which was low last year.   She recalls a severe migraine a few weeks ago, accompanied by prolonged vomiting, which she associates with the onset of some of her symptoms. She notes a knot in her chest that was previously painful but is not currently. No thigh pain.         Previous HPI 10/07/2022 Alexandria Russell is a 23 y.o. 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  Symptoms are doing pretty well  overall.  Has ongoing scalp rash no new areas of hair loss.  It is not particularly painful or itchy, still has some posterior nodules and swelling.  No new skin lesions.  She saw plastic surgery to discuss any options for cosmetic effect of the subcutaneous fat atrophy at multiple areas was not recommended any procedural intervention at this time.     Previous HPI 06/04/2022 Alexandria Russell is a 23 y.o. 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  She has not noticed any  new areas of painful subcutaneous nodules or swelling.  Still gets some tender areas on the skin without significant discoloration.  Ongoing mild scalp irritation or scaling and has some cervical lymphadenopathy but not painful.  She has had some episodes of shaking or tremor in her hands lasting several minutes at a time but has not noticed any specific trigger for these.   Previous HPI 02/19/22 Alexandria Russell is a 23 y.o. 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  She has been doing okay on these medications without any major intolerance problem.  Still having some superficial tender areas on the skin.  She saw dermatology and was prescribed triamcinolone  ointment.  She saw neurology for the new headache problems and had head imaging with no intracranial problem of note did have some scalp changes consistent with cutaneous lesions.   Previous HPI 11/27/2021 Alexandria Russell is a 23 y.o. 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  Since her last visit she is having some increased symptoms in multiple areas.  Has increased joint pain affecting hands and wrists most commonly after she exerts herself more heavily such as having to move and lift a lot of objects for work.  She is having ongoing trouble with migraine headaches these are associated with light sensitivity and gets pain lateral to the eye sometimes affects both sides.  She was treated for asymptomatic chlamydia infection detected on STI screening in June finished antibiotics for this completely.  We had to switch her to oral methotrexate  due to unavailability of the subcutaneous dosing.  She is reporting lower abdominal pain and GI intolerance worse for up to about 3 days of the week when taking her methotrexate .   Previous HPI 08/29/2021 Alexandria Russell is a 23 y.o. 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 She has some ongoing pain in the backs of her legs in previously affected areas. She notices some pain between the thumb and index finger of both hands without visible changes. Her back pain and muscle spasm improved with as needed use of flexeril . She is scheduled for EGD/colonoscopy tomorrow for dysphagia symptoms.   Previous HPI 07/31/2021 Alexandria Russell is a 23 y.o. 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 She has been doing fairly well but with increased pain and stiffness in her upper arms and upper legs in past 2 weeks. She notices mildly tender right sided cervical lymph node swelling painful to lie or turn to that side. She had some upper respiratory symptoms she thinks were seasonal allergies with some drainage. Fatigue is doing well. Still having frequent abdominal pain and feels like the overlying skin change  and swelling is no better.   Previous HPI 05/02/21 Alexandria Russell is a 23 y.o. 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 So far not a dramatic change in skin inflammatory changes but face and leg improving, with still areas of raised nodules and recessed skin areas especially on her trunk. No specific intolerance of the HCQ.   Previous HPI 02/21/21 Alexandria Russell is a 23 y.o. 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 She was originally diagnosed about 2 years ago with initial steroid treatment of about 6 months. Primary symptoms at that time include joint pains in multiple sites and skin rashes on the face and nodular skin rashes on her torso and limbs. Diagnosis including skin biopsy taken from the leg and arm and breast. She was treated with imuran  and hydroxychloroquine  apparently stopped imuran  due to nausea and switch to mycophenolate. She stopped  taking all medications due to feeling quite sick on the combination and did not follow up regularly for about 2 years. She experienced several flares of symptom worsening with joint pain and pain over areas of skin swelling and redness usually treated with short term steroid treatments. She moved from [place] area to [place] around the start of this year and established with local physician treated with oral prednisone  5mg  daily at this time partially controlling symptoms. She last had an episode of increased symptoms about 3 weeks ago. She is also taking high dose weekly vitamin d  supplementation for low vitamin D . Besides the panniculitis symptoms she also reports patchy alopecia on the scalp and on temporal areas associated with posterior cervical adenopathy. She does notice symptoms increased with high amounts of sun exposure. She has lost about 10-20 pounds since this all started but stable during the past year. She denies any raynaud's symptoms or any history of blood clots.   Labs reviewed 12/2020 Vit D 15.0   05/2018 ANA 1:160 TPMT 18.9   Review of Systems  Constitutional:  Negative for fatigue.  HENT:  Negative for mouth sores and mouth dryness.   Eyes:  Negative for dryness.  Respiratory:  Negative for shortness of breath.   Cardiovascular:  Negative for chest pain and palpitations.  Gastrointestinal:  Negative for blood in stool, constipation and diarrhea.  Endocrine: Negative for increased urination.  Genitourinary:  Negative for involuntary urination.  Musculoskeletal:  Negative for joint pain, gait problem, joint pain, joint swelling, myalgias, muscle weakness, morning stiffness, muscle tenderness and myalgias.  Skin:  Positive for hair loss and sensitivity to sunlight. Negative for color change and rash.  Allergic/Immunologic: Negative for susceptible to infections.  Neurological:  Negative for dizziness and headaches.  Hematological:  Positive for swollen glands.   Psychiatric/Behavioral:  Negative for depressed mood and sleep disturbance. The patient is not nervous/anxious.     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   Objective: Vital Signs: BP 110/74 (BP Location: Left Arm, Patient Position: Sitting, Cuff Size: Small)   Pulse 97   Resp 12   Ht 5' (1.524 m)   Wt 136 lb (61.7 kg)   BMI 26.56 kg/m    Physical Exam Eyes:     Conjunctiva/sclera: Conjunctivae normal.  Cardiovascular:     Rate and Rhythm: Normal rate and regular rhythm.  Pulmonary:     Effort: Pulmonary effort is normal.     Breath sounds: Normal breath sounds.  Lymphadenopathy:     Cervical: No cervical adenopathy.  Skin:    General: Skin is warm and dry.     Findings: Rash present.     Comments: Flat hypopigmented patches on bilateral forearms Small area of skin indentation on the right dorsal forearm  Neurological:     Mental Status: She is alert.  Psychiatric:        Mood and Affect: Mood normal.      Musculoskeletal Exam:  Tenderness to pressure over traps and levator muscle groups, no palpable nodules swelling or radiation Elbows full ROM no tenderness or swelling Wrists full ROM no tenderness or swelling Fingers full ROM no tenderness or swelling Knees full ROM no tenderness or swelling Ankles full  ROM no tenderness or swelling  Investigation: No additional findings.  Imaging: No results found.  Recent Labs: Lab Results  Component Value Date   WBC 5.7 07/22/2023   HGB 14.2 07/22/2023   PLT 218 07/22/2023   NA 140 07/22/2023   K 3.6 07/22/2023   CL 103 07/22/2023   CO2 28 07/22/2023   GLUCOSE 66 07/22/2023   BUN 6 (L) 07/22/2023   CREATININE 0.65 07/22/2023   BILITOT 0.4 07/22/2023   ALKPHOS 79 12/29/2020   AST 16 07/22/2023   ALT 8 07/22/2023   PROT 8.1 07/22/2023   ALBUMIN 4.2 12/29/2020   CALCIUM 9.4 07/22/2023    Speciality Comments: PLQ Eye Exam: 12/06/2021 WNL @ [facility] Follow up in 1 year  -Patient states she had an eye exam about two weeks ago and will have it faxed over.   Procedures:  No procedures performed Allergies: Penicillins   Assessment / Plan:     Visit Diagnoses: Lupus profundus - Continue clobetasol  application twice daily. Recent hives and rashes likely due to UV exposure. Hydroxychloroquine  may increase photosensitivity. Joint pain and stiffness in the neck area likely from physical activity.  Not sure if the symptoms are definite lupus flare at 30 suspicious for at least the skin, will check labs for evidence of systemic lupus activity. - Checking dsDNA, complements, sed rate - Continue HCQ 200 mg daily - Topical steroid twice daily as needed  High risk medication use - hydroxychloroquine  200 mg, PLQ Eye Exam: 12/06/2021, needs updated PLQ eye exam.  Vitamin D  deficiency Vitamin D  level low, can worsen lupus skin disease. African American women at higher risk due to less sun exposure and increased melanin. - Prescribe high-dose vitamin D  supplement once a week for three months. - Recommend transitioning to over-the-counter vitamin D  supplement after three months.   Joint pain and stiffness Recent neck joint pain and stiffness likely from physical activity. No significant changes in joint pain or swelling  recently.    Orders: No orders of the defined types were  placed in this encounter.  No orders of the defined types were placed in this encounter.    Follow-Up Instructions: No follow-ups on file.   Lonni LELON Ester, MD  Note - This record has been created using [employer].  Chart creation errors have been sought, but may not always  have been located. Such creation errors do not reflect on  the standard of medical care.

## 2023-10-18 DIAGNOSIS — L509 Urticaria, unspecified: Secondary | ICD-10-CM | POA: Insufficient documentation

## 2023-10-28 ENCOUNTER — Ambulatory Visit: Attending: Internal Medicine | Admitting: Internal Medicine

## 2023-10-28 ENCOUNTER — Encounter: Payer: Self-pay | Admitting: Internal Medicine

## 2023-10-28 VITALS — BP 110/74 | HR 97 | Resp 12 | Ht 60.0 in | Wt 136.0 lb

## 2023-10-28 DIAGNOSIS — Z79899 Other long term (current) drug therapy: Secondary | ICD-10-CM | POA: Diagnosis not present

## 2023-10-28 DIAGNOSIS — E559 Vitamin D deficiency, unspecified: Secondary | ICD-10-CM | POA: Insufficient documentation

## 2023-10-28 DIAGNOSIS — L932 Other local lupus erythematosus: Secondary | ICD-10-CM | POA: Diagnosis not present

## 2023-10-28 MED ORDER — VITAMIN D (ERGOCALCIFEROL) 1.25 MG (50000 UNIT) PO CAPS: 50000.0000 [IU] | ORAL_CAPSULE | ORAL | 0 refills | Status: DC

## 2023-10-31 LAB — SEDIMENTATION RATE: Sed Rate: 2 mm/h (ref 0–20)

## 2023-10-31 LAB — C3 AND C4
C3 Complement: 147 mg/dL (ref 83–193)
C4 Complement: 25 mg/dL (ref 15–57)

## 2023-10-31 LAB — ANTI-DNA ANTIBODY, DOUBLE-STRANDED: ds DNA Ab: <1 [IU]/mL

## 2023-11-04 ENCOUNTER — Encounter: Payer: Self-pay | Admitting: Internal Medicine

## 2023-11-04 DIAGNOSIS — R110 Nausea: Secondary | ICD-10-CM

## 2023-11-06 ENCOUNTER — Ambulatory Visit: Admitting: Dermatology

## 2023-11-06 ENCOUNTER — Encounter: Payer: Self-pay | Admitting: Dermatology

## 2023-11-06 VITALS — BP 108/74 | HR 85

## 2023-11-06 DIAGNOSIS — L219 Seborrheic dermatitis, unspecified: Secondary | ICD-10-CM

## 2023-11-06 DIAGNOSIS — L932 Other local lupus erythematosus: Secondary | ICD-10-CM | POA: Diagnosis not present

## 2023-11-06 MED ORDER — CLOBETASOL PROPIONATE 0.05 % EX SOLN: 1.0000 | Freq: Two times a day (BID) | CUTANEOUS | 8 refills | Status: DC

## 2023-11-06 MED ORDER — TRIAMCINOLONE ACETONIDE 10 MG/ML IJ SUSP
20.0000 mg | Freq: Once | INTRAMUSCULAR | Status: AC
  Administered 2023-11-06: 20 mg

## 2023-11-06 NOTE — Progress Notes (Signed)
   Follow-Up Visit   Subjective  Alexandria Russell is a 23 y.o. 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 Patient was last evaluated on 06/16/23. At this visit patient was prescribed Clobetasol  drops to apply the the scalp, Recommended to wash hair with DHS Zinc Shampoo, Recommended to continue taking Plaquenil  along with otc vital protein supplement. Patient reports sxs are unchanged. Patient denies medication changes.  The following portions of the chart were reviewed this encounter and updated as appropriate: medications, allergies, medical history  Review of Systems:  No other skin or systemic complaints except as noted in HPI or Assessment and Plan.  Objective  Well appearing patient in no apparent distress; mood and affect are within normal limits.  A focused examination was performed of the following areas: Scalp  Relevant exam findings are noted in the Assessment and Plan.           Assessment & Plan     CUTANEOUS LUPUS ERYTHEMATOSUS Mid Parietal Scalp Procedure Note Intralesional Injection  Location: Scalp  Informed Consent: Discussed risks (infection, pain, bleeding, bruising, thinning of the skin, loss of skin pigment, lack of resolution, and recurrence of lesion) and benefits of the procedure, as well as the alternatives. Informed consent was obtained. Preparation: The area was prepared a standard fashion.  Anesthesia:None  Procedure Details: An intralesional injection was performed with Kenalog  20 mg/cc. 0.1 cc in total were injected. NDC #: 9996-9505-79 Exp: 06/2024 LOT: [id number]  Total number of injections: 9  Plan: The patient was instructed on post-op care. Recommend OTC analgesia as needed for pain.  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  I, Gareth Fitzner, am acting as Neurosurgeon for [employer], DO.  Documentation: I have reviewed the above documentation for accuracy and completeness, and I agree with the above.  Delon Lenis, DO

## 2023-11-06 NOTE — Patient Instructions (Addendum)
 Date: Thu Nov 06 2023  Hello Alexandria Russell,  Thank you for visiting today. Here is a summary of the key instructions:  - Medications:   - Apply clobetasol  daily to affected areas   - Continue taking Plaquenil  daily for inflammation  - Treatments:   - Kenalog  10 injections administered for hair growth   - Use DHS zinc shampoo for dandruff control   - Use niacin shampoo to manage itching and yeast count  - Lifestyle Changes:   - Continue healthy diet with minimal sugar intake   - Take vital protein collagen to strengthen new hair growth   - Practice gentle hair detangling when washing  - Follow-up:   - Schedule a follow-up appointment in 3 months to assess hair growth   - Access after-visit summary online  - Side Effects to Watch For:   - Possible temporary local atrophy at injection sites   - Potential localized pain or rare headaches from Kenalog  injections  Please reach out if you have any questions or concerns.  Warm regards,  Dr. Delon Lenis, Dermatology            Important Information   Due to recent changes in healthcare laws, you may see results of your pathology and/or laboratory studies on MyChart before the doctors have had a chance to review them. We understand that in some cases there may be results that are confusing or concerning to you. Please understand that not all results are received at the same time and often the doctors may need to interpret multiple results in order to provide you with the best plan of care or course of treatment. Therefore, we ask that you please give us  2 business days to thoroughly review all your results before contacting the office for clarification. Should we see a critical lab result, you will be contacted sooner.     If You Need Anything After Your Visit   If you have any questions or concerns for your doctor, please call our main line at [phone]. If no one answers, please leave a voicemail as directed and we will  return your call as soon as possible. Messages left after 4 pm will be answered the following business day.    You may also send us  a message via MyChart. We typically respond to MyChart messages within 1-2 business days.  For prescription refills, please ask your pharmacy to contact our office. Our fax number is [phone].  If you have an urgent issue when the clinic is closed that cannot wait until the next business day, you can page your doctor at the number below.     Please note that while we do our best to be available for urgent issues outside of office hours, we are not available 24/7.    If you have an urgent issue and are unable to reach us , you may choose to seek medical care at your doctor's office, retail clinic, urgent care center, or emergency room.   If you have a medical emergency, please immediately call 911 or go to the emergency department. In the event of inclement weather, please call our main line at [phone] for an update on the status of any delays or closures.  Dermatology Medication Tips: Please keep the boxes that topical medications come in in order to help keep track of the instructions about where and how to use these. Pharmacies typically print the medication instructions only on the boxes and not directly on the medication tubes.  If your medication is too expensive, please contact our office at [phone] or send us  a message through MyChart.    We are unable to tell what your co-pay for medications will be in advance as this is different depending on your insurance coverage. However, we may be able to find a substitute medication at lower cost or fill out paperwork to get insurance to cover a needed medication.    If a prior authorization is required to get your medication covered by your insurance company, please allow us  1-2 business days to complete this process.   Drug prices often vary depending on where the prescription is filled and some  pharmacies may offer cheaper prices.   The website www.goodrx.com contains coupons for medications through different pharmacies. The prices here do not account for what the cost may be with help from insurance (it may be cheaper with your insurance), but the website can give you the price if you did not use any insurance.  - You can print the associated coupon and take it with your prescription to the pharmacy.  - You may also stop by our office during regular business hours and pick up a GoodRx coupon card.  - If you need your prescription sent electronically to a different pharmacy, notify our office through [facility] or by phone at [phone]

## 2023-11-18 DIAGNOSIS — Z30013 Encounter for initial prescription of injectable contraceptive: Secondary | ICD-10-CM | POA: Diagnosis not present

## 2023-11-26 NOTE — Telephone Encounter (Signed)
 New referral sent for chronic GI symptoms particularly stomach pain. Not well explained by her known lupus activity.

## 2023-12-10 ENCOUNTER — Encounter: Payer: Self-pay | Admitting: Gastroenterology

## 2024-01-20 NOTE — Progress Notes (Signed)
 Patient had to leave due to prior appointments and clinic running significantly behind schedule. Plan to reschedule at alternative date. Reviewed interval labs and dermatology clinic notes. Stopping high dose weekly vitamin D  and continuing current hydroxychloroquine .

## 2024-01-21 ENCOUNTER — Ambulatory Visit: Payer: Self-pay | Admitting: Internal Medicine

## 2024-01-21 ENCOUNTER — Other Ambulatory Visit: Payer: Self-pay | Admitting: Internal Medicine

## 2024-01-21 DIAGNOSIS — E559 Vitamin D deficiency, unspecified: Secondary | ICD-10-CM

## 2024-01-21 NOTE — Telephone Encounter (Signed)
 Last Fill: 10/28/2023  Next Visit: 02/02/2024  Last Visit: 10/28/2023  Dx: Lupus profundus   Current Dose per office note on 10/28/2023: dose not mentioned  Okay to refill Vitamin D  ?

## 2024-01-30 ENCOUNTER — Other Ambulatory Visit

## 2024-01-30 ENCOUNTER — Ambulatory Visit: Admitting: Gastroenterology

## 2024-01-30 ENCOUNTER — Encounter: Payer: Self-pay | Admitting: Gastroenterology

## 2024-01-30 VITALS — BP 104/62 | HR 83 | Ht 60.0 in | Wt 140.1 lb

## 2024-01-30 DIAGNOSIS — R1030 Lower abdominal pain, unspecified: Secondary | ICD-10-CM | POA: Diagnosis not present

## 2024-01-30 DIAGNOSIS — G43909 Migraine, unspecified, not intractable, without status migrainosus: Secondary | ICD-10-CM | POA: Diagnosis not present

## 2024-01-30 DIAGNOSIS — R110 Nausea: Secondary | ICD-10-CM | POA: Insufficient documentation

## 2024-01-30 DIAGNOSIS — R112 Nausea with vomiting, unspecified: Secondary | ICD-10-CM

## 2024-01-30 DIAGNOSIS — R194 Change in bowel habit: Secondary | ICD-10-CM | POA: Diagnosis not present

## 2024-01-30 MED ORDER — DICYCLOMINE HCL 10 MG PO CAPS: 10.0000 mg | ORAL_CAPSULE | Freq: Two times a day (BID) | ORAL | 5 refills | Status: AC

## 2024-01-30 NOTE — Progress Notes (Addendum)
 01/30/2024 Alexandria Russell [id number] [birth year]  Discussed the use of AI scribe software for clinical note transcription with the patient, who gave verbal consent to proceed.  History of Present Illness Alexandria Russell is a 23 year old female who presents with abdominal pain and gastrointestinal symptoms.  She has been experiencing constant stomach cramps and increased frequency of bowel movements over the past three to four months. The pain worsens after eating and is associated with bloating and a sensation of fullness. It is located in the lower abdominal area and is exacerbated by eating.  There is an increase in bowel movement frequency from once daily to three or four times a day, including nocturnal awakenings to urinate and defecate. Stools are generally formed but become watery or loose during nighttime episodes. No blood in the stool is noted, but a darker brown color is mentioned, although no black.  She experiences nausea and vomiting associated with migraines, which occur every weekend and are accompanied by diarrhea. Dietary changes, such as avoiding chicken tenders. She consumes a lot of water and minimal caffeine, with occasional Red Bull intake.  Her family history includes diverticulitis in her mother and kidney disease in her grandmother and aunt. There is no known family history of Crohn's disease, celiac disease, or irritable bowel syndrome.  She previously underwent a CT scan in 12/2020 for abdominal pain, which was unremarkable. An EGD and colonoscopy in May 2023 was performed and we will try to obtain those records but she does not recall any major findings.  She recalls taking dicyclomine  in the past for abdominal cramping but does not remember its effectiveness.   She follows with neurology for her migraines and also follows with a rheumatologist for her lupus.  CT scan of the abdomen and pelvis with contrast 12/2020:  IMPRESSION: Patchy areas of infiltration  of subcutaneous fat of the abdominopelvic bowl and included upper thighs. For example, ventral abdominal wall near the umbilicus. This is nonspecific but could be infectious/inflammatory.   Otherwise unremarkable study.  CBC and CMP earlier this month looked okay.  Sed rate normal in July.  Last TSH was checked a couple years ago, but was normal at that time.   Past Medical History:  Diagnosis Date   Lupus    Vitamin D  deficiency    Past Surgical History:  Procedure Laterality Date   SKIN BIOPSY Right     reports that she has never smoked. She has never been exposed to tobacco smoke. She has never used smokeless tobacco. She reports that she does not currently use alcohol. She reports that she does not currently use drugs. family history includes Diverticulitis in her mother. Allergies  Allergen Reactions   Penicillins Other (See Comments), Hives, Itching, Nausea And Vomiting, Photosensitivity, Rash and Swelling      Outpatient Encounter Medications as of 01/30/2024  Medication Sig   clobetasol  (TEMOVATE ) 0.05 % external solution Apply 1 Application topically 2 (two) times daily.   hydroxychloroquine  (PLAQUENIL ) 200 MG tablet Take 1 tablet (200 mg total) by mouth daily.   ondansetron  (ZOFRAN -ODT) 4 MG disintegrating tablet Take 1 tablet (4 mg total) by mouth every 8 (eight) hours as needed.   Ubrogepant  (UBRELVY ) 100 MG TABS Take 1 tablet (100 mg total) by mouth as needed.   Vitamin D , Ergocalciferol , (DRISDOL ) 1.25 MG (50000 UNIT) CAPS capsule Take 1 capsule (50,000 Units total) by mouth every 7 (seven) days.   nortriptyline  (PAMELOR ) 10 MG capsule Take 2 capsules (20 mg  total) by mouth at bedtime. Take 1 capsule (10 mg) for 1 week, then increase to 2 capsules (20 mg) thereafter. (Patient not taking: Reported on 01/30/2024)   No facility-administered encounter medications on file as of 01/30/2024.    REVIEW OF SYSTEMS  : All other systems reviewed and negative except where  noted in the History of Present Illness.   PHYSICAL EXAM: BP 104/62 (BP Location: Right Arm, Patient Position: Sitting, Cuff Size: Normal)   Pulse 83   Ht 5' (1.524 m)   Wt 140 lb 2 oz (63.6 kg)   BMI 27.37 kg/m  General: Well developed AA female in no acute distress Head: Normocephalic and atraumatic Eyes:  Sclerae anicteric, conjunctiva pink. Ears: Normal auditory acuity Lungs: Clear throughout to auscultation; no W/R/R. Heart: Regular rate and rhythm; no M/R/G. Abdomen: Soft, non-distended.  BS present.  Mild diffuse TTP. Musculoskeletal: Symmetrical with no gross deformities  Skin: No lesions on visible extremities Extremities: No edema  Neurological: Alert oriented x 4, grossly non-focal Psychological:  Alert and cooperative. Normal mood and affect  Assessment & Plan Chronic abdominal pain with altered bowel habits Chronic abdominal pain for 3-4 months with increased bowel movements, cramping, and bloating, exacerbated postprandially. Differential diagnosis includes abdominal migraines, irritable bowel syndrome, celiac disease, and Crohn's disease. Previous CT scan and colonoscopy in 2023 showed no significant findings. No family history of Crohn's or celiac disease. No blood in stool. - Ordered blood test for celiac disease. - Ordered fecal calprotectin. - Prescribed dicyclomine  10 mg twice daily for abdominal cramping. - Obtained records from previous colonoscopy and endoscopy. - Will consider repeat CT scan if necessary after reviewing results.  Migraine with associated gastrointestinal symptoms Migraines associated with nausea, vomiting, and diarrhea, particularly severe during migraine episodes. Symptoms possibly correlate with dietary intake, though specific triggers are unclear. Previous CT scan in 2022 for abdominal pain was unremarkable. No alcohol consumption due to migraine triggers.  Follows with neurology. - Monitor dietary triggers and symptoms.    **Addendum  records from [facility].  She had a colonoscopy in May 2023 that showed normal ileum, internal hemorrhoids, and 2 polyps were both 3 mm in size.  1 was a tubular adenoma and 1 was hyperplastic.  Random colon biopsies were normal.  Colonoscopy is performed for complaints of hematochezia.  EGD performed at the same time in May 2023 showed mild gastritis, otherwise normal.  Dilation of the esophagus was performed with [facility] dilator with no resistance at 10 French, 50 French, 8 French for complaints of dysphagia.  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 pylori.  Esophageal biopsy showed reactive squamous mucosa negative for metaplasia and eosinophilic esophagitis.  Records will be sent for scanning.    CC:  Loring Leeds, NP

## 2024-01-30 NOTE — Patient Instructions (Addendum)
 Your provider has requested that you go to the basement level for lab work before leaving today. Press B on the elevator. The lab is located at the first door on the left as you exit the elevator.  We have sent the following medications to your pharmacy for you to pick up at your convenience: Dicyclomine  10 mg twice daily for abdominal pain.  _______________________________________________________  If your blood pressure at your visit was 140/90 or greater, please contact your primary care physician to follow up on this.  _______________________________________________________  If you are age 23 or older, your body mass index should be between 23-30. Your Body mass index is 27.37 kg/m. If this is out of the aforementioned range listed, please consider follow up with your Primary Care Provider.  If you are age 23 or younger, your body mass index should be between 19-25. Your Body mass index is 27.37 kg/m. If this is out of the aformentioned range listed, please consider follow up with your Primary Care Provider.   ________________________________________________________  The [facility] providers would like to encourage you to use MYCHART to communicate with providers for non-urgent requests or questions.  Due to long hold times on the telephone, sending your provider a message by [facility] may be a faster and more efficient way to get a response.  Please allow 48 business hours for a response.  Please remember that this is for non-urgent requests.  _______________________________________________________  [facility] is using a team-based approach to care.  Your team is made up of your doctor and two to three APPS. Our APPS (Nurse Practitioners and Physician Assistants) work with your physician to ensure care continuity for you. They are fully qualified to address your health concerns and develop a treatment plan. They communicate directly with your gastroenterologist to care for you.  Seeing the Advanced Practice Practitioners on your physician's team can help you by facilitating care more promptly, often allowing for earlier appointments, access to diagnostic testing, procedures, and other specialty referrals.

## 2024-01-31 LAB — TISSUE TRANSGLUTAMINASE ABS,IGG,IGA
(tTG) Ab, IgA: <1 U/mL
(tTG) Ab, IgG: <1 U/mL

## 2024-01-31 LAB — IGA: Immunoglobulin A: 184 mg/dL (ref 47–310)

## 2024-02-02 ENCOUNTER — Encounter: Payer: Self-pay | Admitting: Internal Medicine

## 2024-02-02 ENCOUNTER — Ambulatory Visit: Payer: Self-pay | Admitting: Gastroenterology

## 2024-02-02 ENCOUNTER — Ambulatory Visit: Admitting: Internal Medicine

## 2024-02-02 VITALS — BP 105/64 | HR 87 | Temp 98.2°F | Resp 16 | Ht 60.0 in | Wt 142.0 lb

## 2024-02-02 DIAGNOSIS — Z79899 Other long term (current) drug therapy: Secondary | ICD-10-CM

## 2024-02-02 DIAGNOSIS — E559 Vitamin D deficiency, unspecified: Secondary | ICD-10-CM

## 2024-02-02 DIAGNOSIS — L932 Other local lupus erythematosus: Secondary | ICD-10-CM

## 2024-02-02 MED ORDER — HYDROXYCHLOROQUINE SULFATE 200 MG PO TABS: 200.0000 mg | ORAL_TABLET | Freq: Every day | ORAL | 1 refills | Status: AC

## 2024-02-03 ENCOUNTER — Ambulatory Visit: Admitting: Dermatology

## 2024-02-03 ENCOUNTER — Encounter: Payer: Self-pay | Admitting: Dermatology

## 2024-02-03 DIAGNOSIS — L932 Other local lupus erythematosus: Secondary | ICD-10-CM

## 2024-02-03 DIAGNOSIS — L219 Seborrheic dermatitis, unspecified: Secondary | ICD-10-CM | POA: Diagnosis not present

## 2024-02-03 MED ORDER — FLUOCINOLONE ACETONIDE SCALP 0.01 % EX OIL: 1.0000 | TOPICAL_OIL | CUTANEOUS | 6 refills | Status: AC | PRN

## 2024-02-03 MED ORDER — TRIAMCINOLONE ACETONIDE 10 MG/ML IJ SUSP
10.0000 mg | Freq: Once | INTRAMUSCULAR | Status: AC
  Administered 2024-02-03: 10 mg

## 2024-02-03 MED ORDER — CLOBETASOL PROPIONATE 0.05 % EX SOLN: 1.0000 | Freq: Two times a day (BID) | CUTANEOUS | 10 refills | Status: AC

## 2024-02-03 NOTE — Patient Instructions (Addendum)
 VISIT SUMMARY:  You came in today for a follow-up regarding your hair loss and scalp issues related to your continuous lupus and seborrheic dermatitis. We discussed your current treatments and made some adjustments to help manage your symptoms better.  YOUR PLAN:  -DISCOID LUPUS ERYTHEMATOSUS OF THE SCALP: Discoid lupus erythematosus is a chronic skin condition that causes sores, inflammation, and scarring, often on the scalp. You have been managing this with Plaquenil  and previously azathioprine . Today, you received Kenalog  injections at the areas of hair loss on your scalp. Continue applying clobetasol  twice daily, and you have been prescribed DermaSmooth oil for your scalp. Your clobetasol  prescription has also been refilled.  -SEBORRHEIC DERMATITIS OF THE SCALP:  Seborrheic dermatitis is a chronic skin condition that causes itching and flaking, often due to oil and yeast buildup. It can be worsened by infrequent hair washing and the use of scalp oils. You should wash your hair every two weeks or scrub it thoroughly with water if not using shampoo. Stop using daily scalp oils, and use the prescribed DermaSmooth oil as directed.  INSTRUCTIONS:  Please follow up as needed if your symptoms do not improve or if you have any new concerns. Continue with your current treatments and the new recommendations provided today.     Important Information   Due to recent changes in healthcare laws, you may see results of your pathology and/or laboratory studies on MyChart before the doctors have had a chance to review them. We understand that in some cases there may be results that are confusing or concerning to you. Please understand that not all results are received at the same time and often the doctors may need to interpret multiple results in order to provide you with the best plan of care or course of treatment. Therefore, we ask that you please give us  2 business days to thoroughly review all your  results before contacting the office for clarification. Should we see a critical lab result, you will be contacted sooner.     If You Need Anything After Your Visit   If you have any questions or concerns for your doctor, please call our main line at [phone]. If no one answers, please leave a voicemail as directed and we will return your call as soon as possible. Messages left after 4 pm will be answered the following business day.    You may also send us  a message via MyChart. We typically respond to MyChart messages within 1-2 business days.  For prescription refills, please ask your pharmacy to contact our office. Our fax number is [phone].  If you have an urgent issue when the clinic is closed that cannot wait until the next business day, you can page your doctor at the number below.     Please note that while we do our best to be available for urgent issues outside of office hours, we are not available 24/7.    If you have an urgent issue and are unable to reach us , you may choose to seek medical care at your doctor's office, retail clinic, urgent care center, or emergency room.   If you have a medical emergency, please immediately call 911 or go to the emergency department. In the event of inclement weather, please call our main line at [phone] for an update on the status of any delays or closures.  Dermatology Medication Tips: Please keep the boxes that topical medications come in in order to help keep track of the instructions about  where and how to use these. Pharmacies typically print the medication instructions only on the boxes and not directly on the medication tubes.   If your medication is too expensive, please contact our office at [phone] or send us  a message through MyChart.    We are unable to tell what your co-pay for medications will be in advance as this is different depending on your insurance coverage. However, we may be able to find a substitute  medication at lower cost or fill out paperwork to get insurance to cover a needed medication.    If a prior authorization is required to get your medication covered by your insurance company, please allow us  1-2 business days to complete this process.   Drug prices often vary depending on where the prescription is filled and some pharmacies may offer cheaper prices.   The website www.goodrx.com contains coupons for medications through different pharmacies. The prices here do not account for what the cost may be with help from insurance (it may be cheaper with your insurance), but the website can give you the price if you did not use any insurance.  - You can print the associated coupon and take it with your prescription to the pharmacy.  - You may also stop by our office during regular business hours and pick up a GoodRx coupon card.  - If you need your prescription sent electronically to a different pharmacy, notify our office through [facility] or by phone at [phone]

## 2024-02-03 NOTE — Progress Notes (Signed)
 ____________________________________________________________  Attending physician addendum:  Thank you for sending this case to me. I have reviewed the entire note and agree with the plan.   Victory Brand, MD  ____________________________________________________________

## 2024-02-03 NOTE — Progress Notes (Signed)
   Follow-Up Visit   Subjective  Alexandria Russell is a 23 y.o. 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 Patient was last evaluated on 11/06/23. At this visit patient had ILK I jections completed. She was also advised to continue topical Clobetasol  solution and oral Plaquenil . She was also recommended to take daily Vivascal supplements. Patient reports sxs are better. Patient denies medication changes. Patient she is not actively pregnant, trying to conceive or nursing.   The following portions of the chart were reviewed this encounter and updated as appropriate: medications, allergies, medical history  Review of Systems:  No other skin or systemic complaints except as noted in HPI or Assessment and Plan.  Objective  Well appearing patient in no apparent distress; mood and affect are within normal limits.  A focused examination was performed of the following areas: Scalp  Relevant exam findings are noted in the Assessment and Plan.      Assessment & Plan   Discoid lupus erythematosus of the scalp Diagnosed in 2019, previously treated with Plaquenil  and azathioprine . Azathioprine  discontinued due to non-compliance and normal levels. Currently on Plaquenil . Recent hair loss on the vertex of the scalp, treated with Kenalog  injections and topical clobetasol . Hair regrowth noted, but new areas of hair loss and itching present. Possible tension from wearing a ponytail contributing to symptoms. - Administered Kenalog  injections at areas of hair loss on the vertex of the scalp. - Continue topical clobetasol  application twice daily. - Prescribed DermaSmooth oil for scalp treatment. - Refilled clobetasol  prescription.  Seborrheic dermatitis  Chronic condition with itching and flaking, exacerbated by infrequent hair washing and use of scalp oils. Buildup of oil and yeast contributing to symptoms.  Advised against daily oil application due to exacerbation of symptoms. - Advised washing hair every two weeks or using water and rigorously scrubbing if not shampooing. - Discontinued daily use of scalp oils. - Educated on the use of DermaSmooth oil as a prescription oil treatment.   CUTANEOUS LUPUS ERYTHEMATOSUS Mid Parietal Scalp Procedure Note Intralesional Injection  Location: Scalp  Informed Consent: Discussed risks (infection, pain, bleeding, bruising, thinning of the skin, loss of skin pigment, lack of resolution, and recurrence of lesion) and benefits of the procedure, as well as the alternatives. Informed consent was obtained. Preparation: The area was prepared a standard fashion.  Anesthesia:None  Procedure Details: An intralesional injection was performed with Kenalog  10 mg/cc. 0.15 cc in total were injected. NDC #: 9996-9505-79 Exp: 06/2024 LOT: [id number]  Total number of injections: 6  Plan: The patient was instructed on post-op care. Recommend OTC analgesia as needed for pain.  Related Medications clobetasol  (TEMOVATE ) 0.05 % external solution Apply 1 Application topically 2 (two) times daily. triamcinolone  acetonide (KENALOG ) 10 MG/ML injection 10 mg  SEBORRHEIC DERMATITIS   Related Medications clobetasol  (TEMOVATE ) 0.05 % external solution Apply 1 Application topically 2 (two) times daily. Fluocinolone Acetonide Scalp (DERMA-SMOOTHE/FS SCALP) 0.01 % OIL Apply 1 Application topically as needed.  Return in about 5 months (around 07/03/2024) for Cutaneous lupus erythematosus and Seb Derm F/U.  I, Jetta Ager, am acting as Neurosurgeon for [employer], DO.  Documentation: I have reviewed the above documentation for accuracy and completeness, and I agree with the above.  Delon Lenis, DO

## 2024-02-11 ENCOUNTER — Other Ambulatory Visit

## 2024-02-11 ENCOUNTER — Other Ambulatory Visit: Payer: Self-pay

## 2024-02-11 DIAGNOSIS — R1030 Lower abdominal pain, unspecified: Secondary | ICD-10-CM | POA: Diagnosis not present

## 2024-02-11 DIAGNOSIS — R110 Nausea: Secondary | ICD-10-CM | POA: Diagnosis not present

## 2024-02-11 DIAGNOSIS — R194 Change in bowel habit: Secondary | ICD-10-CM

## 2024-02-14 LAB — SPECIMEN STATUS REPORT

## 2024-02-14 LAB — CALPROTECTIN, FECAL: Calprotectin, Fecal: 40 ug/g (ref 0–120)

## 2024-02-23 NOTE — Progress Notes (Signed)
 Office Visit Note  Patient: Alexandria Russell             Date of Birth: 10/12/2000           MRN: [id number]             PCP: Valma Lannie LABOR PA-C Referring: Loring Leeds, NP Visit Date: 03/08/2024   Subjective:    Discussed the use of AI scribe software for clinical note transcription with the patient, who gave verbal consent to proceed.  History of Present Illness   Alexandria Russell is a 23 y.o. 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   Her scalp condition has improved following intralesional injections, and she reports that it is hard to tell where the spots are, with hair growing in very fine. She continues daily use of clobetasol  on her scalp and thinks it is working. No new rashes or bruising are noted elsewhere.  She completed a course of high-dose vitamin D  once a week and is interested in rechecking her levels, as they were low earlier this year. She feels better since taking vitamin D , with reduced night sweats and general malaise. She takes over-the-counter vitamin D  and C sporadically.  She currently has congestion, likely from a cold caught from a coworker, but denies any significant illness since her last visit. She experiences sinus issues but no neck swelling. She uses chloraseptic for throat discomfort, which has been helpful. She notes a rash behind her ear, possibly related to a new hairstyle, but no other new skin issues.  About two weeks ago, she experienced swelling and temporary immobility in her hand while at work, lasting about two minutes. She describes it as worse than a cramp but without numbness or tingling.  She has ongoing stomach issues and previously saw a gastroenterologist at [facility], but felt the provider was not informed about her health history. Despite normal stool and blood tests, her symptoms persist without follow-up. She prefers to see a doctor more familiar with her case.  She has an  ingrown toenail that was previously treated but it did not resolve well. She has another ingrown toenail causing discomfort and is considering seeing a podiatrist. She notes a family history of similar issues, as her father also experiences ingrown toenails.  She feels generally well regarding her lupus, with no recent flare-ups or headaches. She has an upcoming appointment with a neurologist in January.       Previous HPI 10/28/2023 Alexandria Russell is a 23 y.o. 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 She presents with a recent episode of hives and skin rashes.   She experienced an episode of hives and skin rashes [place] The rashes appeared on her arms, legs, and stomach, were itchy but not painful, and lasted for about three to four days. She was exposed to significant sun during her trip but reports consistently using sunscreen, and wonders if the sun exposure could have been a trigger.   She has been using a topical steroid for scalp rashes as prescribed by her dermatologist, along with minoxidil, but has not noticed significant improvement. She is taking Plaquenil  consistently without any issues.   In the past few days, she has experienced joint pain and stiffness in her neck, which she attributes to doing her hair. No recent viral illnesses or the need for antibiotics.     Previous  HPI 07/22/2023 Alexandria Russell is a 23 y.o. 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     She has experienced increased hair loss on her scalp, despite no new skin rashes elsewhere. She has been using topical clobetasol  solution twice daily for the past month, as prescribed by her dermatologist, and has noticed some hair regrowth.   She describes significant stomach  pain, particularly in an area previously affected by lupus profundus, which has been more noticeable after eating over the past month. She reports increased bowel movements, up to three or four times a day, with occasional diarrhea and watery or mucousy stools. This has impacted her ability to work comfortably. No recent viral or respiratory infections, and no constipation, but she does experience diarrhea.   She mentions a change in sleep patterns, waking up around 3 AM and staying awake for about an hour before returning to sleep. She denies any recent illness or unusual dietary changes.   She has started taking collagen, hair, skin, and nails vitamins, and vitamin B12 in the past month. She acknowledges not taking vitamin D  supplements recently, which was low last year.   She recalls a severe migraine a few weeks ago, accompanied by prolonged vomiting, which she associates with the onset of some of her symptoms. She notes a knot in her chest that was previously painful but is not currently. No thigh pain.         Previous HPI 10/07/2022 Alexandria Russell is a 23 y.o. 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  Symptoms are doing pretty well overall.  Has ongoing scalp rash no new areas of hair loss.  It is not particularly painful or itchy, still has some posterior nodules and swelling.  No new skin lesions.  She saw plastic surgery to discuss any options for cosmetic effect of the subcutaneous fat atrophy at multiple areas was not recommended any procedural intervention at this time.     Previous HPI 06/04/2022 Alexandria Russell is a 23 y.o. 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  She has not noticed any new areas of painful subcutaneous nodules or swelling.  Still gets some tender areas on the skin without significant discoloration.  Ongoing mild scalp  irritation or scaling and has some cervical lymphadenopathy but not painful.  She has had some episodes of shaking or tremor in her hands lasting several minutes at a time but has not noticed any specific trigger for these.   Previous HPI 02/19/22 Tambria Pfannenstiel is a 23 y.o. 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  She has been doing okay on these medications without any major intolerance problem.  Still having some superficial tender areas on the skin.  She saw dermatology and was prescribed triamcinolone  ointment.  She saw neurology for the new headache problems and had head imaging with no intracranial problem of note did have some scalp changes consistent with cutaneous lesions.   Previous HPI 11/27/2021 Kallyn Demarcus is a 23 y.o. 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  Since her last visit she is having some increased symptoms in multiple areas.  Has increased joint pain affecting  hands and wrists most commonly after she exerts herself more heavily such as having to move and lift a lot of objects for work.  She is having ongoing trouble with migraine headaches these are associated with light sensitivity and gets pain lateral to the eye sometimes affects both sides.  She was treated for asymptomatic chlamydia infection detected on STI screening in June finished antibiotics for this completely.  We had to switch her to oral methotrexate  due to unavailability of the subcutaneous dosing.  She is reporting lower abdominal pain and GI intolerance worse for up to about 3 days of the week when taking her methotrexate .   Previous HPI 08/29/2021 Enedina Pair is a 23 y.o. female here for follow up for lupus profundus inflammatory changes currently on HCQ 200 mg daily and methotrexate  15 mg [place] weekly and folic acid 1 mg daily. She has some ongoing pain in the backs of her legs in  previously affected areas. She notices some pain between the thumb and index finger of both hands without visible changes. Her back pain and muscle spasm improved with as needed use of flexeril . She is scheduled for EGD/colonoscopy tomorrow for dysphagia symptoms.   Previous HPI 07/31/2021 Finnley Lewis is a 23 y.o. 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 She has been doing fairly well but with increased pain and stiffness in her upper arms and upper legs in past 2 weeks. She notices mildly tender right sided cervical lymph node swelling painful to lie or turn to that side. She had some upper respiratory symptoms she thinks were seasonal allergies with some drainage. Fatigue is doing well. Still having frequent abdominal pain and feels like the overlying skin change and swelling is no better.   Previous HPI 05/02/21 Amaiya Scruton is a 23 y.o. 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 So far not a dramatic change in skin inflammatory changes but face and leg improving, with still areas of raised nodules and recessed skin areas especially on her trunk. No specific intolerance of the HCQ.   Previous HPI 02/21/21 Tamyra Fojtik is a 23 y.o. 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 She was originally diagnosed about 2 years ago with initial steroid treatment of about 6 months. Primary symptoms at that time include joint pains in multiple sites and skin rashes on the face and nodular skin rashes on her torso and limbs. Diagnosis including skin biopsy taken from the leg and arm and breast. She was treated with imuran  and hydroxychloroquine  apparently stopped imuran  due to nausea and switch to mycophenolate. She stopped taking all medications due to feeling quite sick on the combination and did not follow up regularly for about 2 years. She experienced several  flares of symptom worsening with joint pain and pain over areas of skin swelling and redness usually treated with short term steroid treatments. She moved from [place] area to [place] around the start of this year and established with local physician treated with oral prednisone  5mg  daily at this time partially controlling symptoms. She last had an episode of increased symptoms about 3 weeks ago. She is also taking high dose weekly vitamin d  supplementation for low vitamin D . Besides the panniculitis symptoms she also reports patchy alopecia on the scalp and on temporal areas associated with posterior cervical adenopathy. She does notice symptoms increased with high amounts of sun  exposure. She has lost about 10-20 pounds since this all started but stable during the past year. She denies any raynaud's symptoms or any history of blood clots.   Labs reviewed 12/2020 Vit D 15.0   05/2018 ANA 1:160 TPMT 18.9    Review of Systems  Constitutional:  Negative for fatigue.  HENT:  Negative for mouth sores and mouth dryness.   Eyes:  Positive for dryness.  Respiratory:  Negative for shortness of breath.   Cardiovascular:  Negative for chest pain and palpitations.  Gastrointestinal:  Negative for blood in stool, constipation and diarrhea.  Endocrine: Negative for increased urination.  Genitourinary:  Negative for involuntary urination.  Musculoskeletal:  Positive for gait problem. Negative for joint pain, joint pain, joint swelling, myalgias, muscle weakness, morning stiffness, muscle tenderness and myalgias.  Skin:  Positive for sensitivity to sunlight. Negative for color change, rash and hair loss.  Allergic/Immunologic: Positive for susceptible to infections.  Neurological:  Negative for dizziness and headaches.  Hematological:  Negative for swollen glands.  Psychiatric/Behavioral:  Negative for depressed mood and sleep disturbance. The patient is not nervous/anxious.     PMFS History:   Patient Active Problem List   Diagnosis Date Noted   Lower abdominal pain 01/30/2024   Nausea 01/30/2024   Change in bowel habits 01/30/2024   Hives 10/18/2023   Low blood sugar 10/14/2023   Muscle spasm 08/29/2021   Migraine headache 05/02/2021   Lupus profundus 02/21/2021   Vitamin D  deficiency 02/21/2021   High risk medication use 02/21/2021    Past Medical History:  Diagnosis Date   Lupus    Vitamin D  deficiency     Family History  Problem Relation Age of Onset   Diverticulitis Mother    Past Surgical History:  Procedure Laterality Date   SKIN BIOPSY Right    Social History   Social History Narrative   Right handed   Caffeine none   apartment   Live with boyfriend   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   Objective: Vital Signs: BP 105/71   Pulse 87   Temp 98.1 F (36.7 C)   Resp 14   Ht 5' (1.524 m)   Wt 142 lb (64.4 kg)   BMI 27.73 kg/m    Physical Exam Eyes:     Conjunctiva/sclera: Conjunctivae normal.  Neck:     Comments: Nontender adenopathy behind ears and middle cervical chain Cardiovascular:     Rate and Rhythm: Normal rate and regular rhythm.  Pulmonary:     Effort: Pulmonary effort is normal.     Breath sounds: Normal breath sounds.  Skin:    General: Skin is warm and dry.     Findings: Rash present.     Comments: Flat hypopigmented patches on bilateral forearms Small area of skin indentation on the outside of upper arm, dorsal forearm, medial thigh Small hyperpigmented patch on back of left ear with small bumps, no scaling or hypopigmented spots  Neurological:     Mental Status: She is alert.  Psychiatric:        Mood and Affect: Mood normal.      Musculoskeletal Exam: Shoulders full ROM no tenderness or swelling Elbows full ROM no tenderness or swelling Wrists full ROM no tenderness or swelling Fingers full ROM no tenderness or swelling Knees full ROM no tenderness  or swelling Ankles full ROM no tenderness or swelling    Investigation:  No additional findings.  Imaging: No results found.  Recent Labs: Lab Results  Component Value Date   WBC 5.7 07/22/2023   HGB 14.2 07/22/2023   PLT 218 07/22/2023   NA 138 03/08/2024   K 3.9 03/08/2024   CL 102 03/08/2024   CO2 27 03/08/2024   GLUCOSE 68 03/08/2024   BUN 7 03/08/2024   CREATININE 0.61 03/08/2024   BILITOT 0.4 03/08/2024   ALKPHOS 79 12/29/2020   AST 17 03/08/2024   ALT 11 03/08/2024   PROT 7.5 03/08/2024   ALBUMIN 4.2 12/29/2020   CALCIUM 9.6 03/08/2024    Speciality Comments: PLQ Eye Exam: 12/06/2021 WNL @ [facility] Follow up in 1 year  -Patient states she had an eye exam about two weeks ago and will have it faxed over.   Procedures:  No procedures performed Allergies: Penicillins   Assessment / Plan:     Visit Diagnoses: Lupus profundus Scalp lesions improving with treatment. Lymphadenopathy likely related to lupus profundus. No new rash or significant lupus flare-ups. - Continue HCQ 200 mg daily - Continue injections and clobetasol  for scalp lesions.  High risk medication use - hydroxychloroquine  200 mg. NEEDS PLQ EYE EXAM  Vitamin D  deficiency - Plan: VITAMIN D  25 Hydroxy (Vit-D Deficiency, Fractures) Vitamin D  deficiency previously treated with high-dose vitamin D . Symptoms improved. Current levels need re-evaluation. - Ordered blood test to check current vitamin D  levels. - Advised continuation of over-the-counter vitamin D  supplementation if levels are adequate.  Cramping of hands - Plan: Comprehensive metabolic panel with GFR  Atopic dermatitis behind right ear New rash likely atopic dermatitis, not lupus-related. Rash appears red with bumps. - Recommended use of moisturizers and intermittent topical steroids.  Chronic gastrointestinal symptoms, etiology undetermined Chronic symptoms persist despite normal tests. Previous gastroenterologist consultation  unsatisfactory. - Contacted gastroenterologist for follow-up and additional insights. - Consider referral to another gastroenterologist if necessary.  Recurrent ingrown toenail Recurrent ingrown toenail causing discomfort. Previous removal unsuccessful. - Referred to podiatrist for evaluation and management.        Orders: Orders Placed This Encounter  Procedures   Comprehensive metabolic panel with GFR   VITAMIN D  25 Hydroxy (Vit-D Deficiency, Fractures)   Ambulatory referral to Podiatry   No orders of the defined types were placed in this encounter.    Follow-Up Instructions: Return in about 6 months (around 09/05/2024) for Lupus profundus on HCQ f/u 6mos.   Lonni LELON Ester, MD  Note - This record has been created using [employer].  Chart creation errors have been sought, but may not always  have been located. Such creation errors do not reflect on  the standard of medical care.

## 2024-03-08 ENCOUNTER — Encounter: Payer: Self-pay | Admitting: Internal Medicine

## 2024-03-08 ENCOUNTER — Ambulatory Visit: Attending: Internal Medicine | Admitting: Internal Medicine

## 2024-03-08 VITALS — BP 105/71 | HR 87 | Temp 98.1°F | Resp 14 | Ht 60.0 in | Wt 142.0 lb

## 2024-03-08 DIAGNOSIS — R194 Change in bowel habit: Secondary | ICD-10-CM | POA: Insufficient documentation

## 2024-03-08 DIAGNOSIS — Z0000 Encounter for general adult medical examination without abnormal findings: Secondary | ICD-10-CM | POA: Diagnosis not present

## 2024-03-08 DIAGNOSIS — Z79899 Other long term (current) drug therapy: Secondary | ICD-10-CM | POA: Insufficient documentation

## 2024-03-08 DIAGNOSIS — R252 Cramp and spasm: Secondary | ICD-10-CM | POA: Insufficient documentation

## 2024-03-08 DIAGNOSIS — L600 Ingrowing nail: Secondary | ICD-10-CM | POA: Diagnosis not present

## 2024-03-08 DIAGNOSIS — D84821 Immunodeficiency due to drugs: Secondary | ICD-10-CM | POA: Diagnosis not present

## 2024-03-08 DIAGNOSIS — L932 Other local lupus erythematosus: Secondary | ICD-10-CM | POA: Insufficient documentation

## 2024-03-08 DIAGNOSIS — G43009 Migraine without aura, not intractable, without status migrainosus: Secondary | ICD-10-CM | POA: Diagnosis not present

## 2024-03-08 DIAGNOSIS — R350 Frequency of micturition: Secondary | ICD-10-CM | POA: Diagnosis not present

## 2024-03-08 DIAGNOSIS — E559 Vitamin D deficiency, unspecified: Secondary | ICD-10-CM | POA: Diagnosis not present

## 2024-03-08 DIAGNOSIS — R110 Nausea: Secondary | ICD-10-CM | POA: Diagnosis not present

## 2024-03-08 DIAGNOSIS — E348 Other specified endocrine disorders: Secondary | ICD-10-CM | POA: Diagnosis not present

## 2024-03-09 LAB — COMPREHENSIVE METABOLIC PANEL WITH GFR
AG Ratio: 1.8 (calc) (ref 1.0–2.5)
ALT: 11 U/L (ref 6–29)
AST: 17 U/L (ref 10–30)
Albumin: 4.8 g/dL (ref 3.6–5.1)
Alkaline phosphatase (APISO): 83 U/L (ref 31–125)
BUN: 7 mg/dL (ref 7–25)
CO2: 27 mmol/L (ref 20–32)
Calcium: 9.6 mg/dL (ref 8.6–10.2)
Chloride: 102 mmol/L (ref 98–110)
Creat: 0.61 mg/dL (ref 0.50–0.96)
Globulin: 2.7 g/dL (ref 1.9–3.7)
Glucose, Bld: 68 mg/dL (ref 65–99)
Potassium: 3.9 mmol/L (ref 3.5–5.3)
Sodium: 138 mmol/L (ref 135–146)
Total Bilirubin: 0.4 mg/dL (ref 0.2–1.2)
Total Protein: 7.5 g/dL (ref 6.1–8.1)
eGFR: 129 mL/min/1.73m2 (ref >=60)

## 2024-03-09 LAB — VITAMIN D 25 HYDROXY (VIT D DEFICIENCY, FRACTURES): Vit D, 25-Hydroxy: 30 ng/mL (ref 30–100)

## 2024-03-17 ENCOUNTER — Ambulatory Visit: Admitting: Nurse Practitioner

## 2024-06-30 ENCOUNTER — Ambulatory Visit: Admitting: Neurology

## 2024-07-08 ENCOUNTER — Ambulatory Visit: Admitting: Dermatology

## 2024-09-08 ENCOUNTER — Ambulatory Visit: Admitting: Internal Medicine
